# Patient Record
Sex: Female | Born: 1950 | Race: White | Hispanic: No | Smoking: Current every day smoker
Health system: Southern US, Community
[De-identification: ages and names within clinical notes are randomized; demographics above are authoritative.]

## PROBLEM LIST (undated history)

## (undated) DIAGNOSIS — Z9119 Patient's noncompliance with other medical treatment and regimen: Secondary | ICD-10-CM

## (undated) DIAGNOSIS — I10 Essential (primary) hypertension: Secondary | ICD-10-CM

## (undated) DIAGNOSIS — Z720 Tobacco use: Secondary | ICD-10-CM

## (undated) DIAGNOSIS — Z91199 Patient's noncompliance with other medical treatment and regimen due to unspecified reason: Secondary | ICD-10-CM

## (undated) DIAGNOSIS — Z789 Other specified health status: Secondary | ICD-10-CM

## (undated) DIAGNOSIS — I5022 Chronic systolic (congestive) heart failure: Secondary | ICD-10-CM

## (undated) DIAGNOSIS — Z7289 Other problems related to lifestyle: Secondary | ICD-10-CM

## (undated) HISTORY — PX: NO PAST SURGERIES: SHX2092

---

## 2016-02-04 ENCOUNTER — Other Ambulatory Visit: Payer: Self-pay | Admitting: Family Medicine

## 2016-02-04 ENCOUNTER — Ambulatory Visit
Admission: RE | Admit: 2016-02-04 | Discharge: 2016-02-04 | Disposition: A | Discharge status: Home / Self Care | Payer: Medicare Other | Source: Ambulatory Visit | Attending: Family Medicine | Admitting: Family Medicine

## 2016-02-04 DIAGNOSIS — I159 Secondary hypertension, unspecified: Secondary | ICD-10-CM

## 2016-02-04 DIAGNOSIS — I10 Essential (primary) hypertension: Secondary | ICD-10-CM | POA: Diagnosis not present

## 2016-02-04 DIAGNOSIS — R0602 Shortness of breath: Secondary | ICD-10-CM

## 2017-03-10 ENCOUNTER — Emergency Department: Payer: Medicare Other

## 2017-03-10 ENCOUNTER — Inpatient Hospital Stay
Admission: EM | Admit: 2017-03-10 | Discharge: 2017-03-12 | DRG: 191 | Disposition: A | Discharge status: Home / Self Care | Payer: Medicare Other | Attending: Internal Medicine | Admitting: Internal Medicine

## 2017-03-10 DIAGNOSIS — Z79899 Other long term (current) drug therapy: Secondary | ICD-10-CM | POA: Diagnosis not present

## 2017-03-10 DIAGNOSIS — F1090 Alcohol use, unspecified, uncomplicated: Secondary | ICD-10-CM | POA: Diagnosis present

## 2017-03-10 DIAGNOSIS — J441 Chronic obstructive pulmonary disease with (acute) exacerbation: Principal | ICD-10-CM | POA: Diagnosis present

## 2017-03-10 DIAGNOSIS — I509 Heart failure, unspecified: Secondary | ICD-10-CM | POA: Diagnosis present

## 2017-03-10 DIAGNOSIS — E876 Hypokalemia: Secondary | ICD-10-CM | POA: Diagnosis present

## 2017-03-10 DIAGNOSIS — I110 Hypertensive heart disease with heart failure: Secondary | ICD-10-CM | POA: Diagnosis present

## 2017-03-10 DIAGNOSIS — Z8249 Family history of ischemic heart disease and other diseases of the circulatory system: Secondary | ICD-10-CM

## 2017-03-10 DIAGNOSIS — F1010 Alcohol abuse, uncomplicated: Secondary | ICD-10-CM | POA: Diagnosis present

## 2017-03-10 DIAGNOSIS — Z789 Other specified health status: Secondary | ICD-10-CM | POA: Diagnosis present

## 2017-03-10 DIAGNOSIS — E871 Hypo-osmolality and hyponatremia: Secondary | ICD-10-CM | POA: Diagnosis present

## 2017-03-10 DIAGNOSIS — R0600 Dyspnea, unspecified: Secondary | ICD-10-CM

## 2017-03-10 DIAGNOSIS — I10 Essential (primary) hypertension: Secondary | ICD-10-CM | POA: Diagnosis present

## 2017-03-10 DIAGNOSIS — F17210 Nicotine dependence, cigarettes, uncomplicated: Secondary | ICD-10-CM | POA: Diagnosis present

## 2017-03-10 DIAGNOSIS — Z7289 Other problems related to lifestyle: Secondary | ICD-10-CM | POA: Diagnosis present

## 2017-03-10 HISTORY — DX: Other problems related to lifestyle: Z72.89

## 2017-03-10 HISTORY — DX: Essential (primary) hypertension: I10

## 2017-03-10 HISTORY — DX: Other specified health status: Z78.9

## 2017-03-10 LAB — URINE DRUG SCREEN, QUALITATIVE (ARMC ONLY)
Amphetamines, Ur Screen: NOT DETECTED
BENZODIAZEPINE, UR SCRN: NOT DETECTED
Barbiturates, Ur Screen: NOT DETECTED
Cannabinoid 50 Ng, Ur ~~LOC~~: NOT DETECTED
Cocaine Metabolite,Ur ~~LOC~~: NOT DETECTED
MDMA (Ecstasy)Ur Screen: NOT DETECTED
METHADONE SCREEN, URINE: NOT DETECTED
Opiate, Ur Screen: NOT DETECTED
PHENCYCLIDINE (PCP) UR S: NOT DETECTED
Tricyclic, Ur Screen: NOT DETECTED

## 2017-03-10 LAB — URINALYSIS, COMPLETE (UACMP) WITH MICROSCOPIC
BILIRUBIN URINE: NEGATIVE
Glucose, UA: NEGATIVE mg/dL
HGB URINE DIPSTICK: NEGATIVE
KETONES UR: NEGATIVE mg/dL
LEUKOCYTES UA: NEGATIVE
NITRITE: NEGATIVE
PROTEIN: 30 mg/dL — AB
Specific Gravity, Urine: 1.015 (ref 1.005–1.030)
pH: 8 (ref 5.0–8.0)

## 2017-03-10 LAB — CBC WITH DIFFERENTIAL/PLATELET
BASOS ABS: 0.1 10*3/uL (ref 0–0.1)
Basophils Relative: 1 %
Eosinophils Absolute: 0 10*3/uL (ref 0–0.7)
Eosinophils Relative: 0 %
HEMATOCRIT: 41.9 % (ref 35.0–47.0)
Hemoglobin: 14.6 g/dL (ref 12.0–16.0)
LYMPHS ABS: 0.9 10*3/uL — AB (ref 1.0–3.6)
LYMPHS PCT: 14 %
MCH: 33.4 pg (ref 26.0–34.0)
MCHC: 34.8 g/dL (ref 32.0–36.0)
MCV: 96.1 fL (ref 80.0–100.0)
Monocytes Absolute: 0.5 10*3/uL (ref 0.2–0.9)
Monocytes Relative: 8 %
NEUTROS ABS: 5.1 10*3/uL (ref 1.4–6.5)
Neutrophils Relative %: 77 %
Platelets: 283 10*3/uL (ref 150–440)
RBC: 4.35 MIL/uL (ref 3.80–5.20)
RDW: 12.5 % (ref 11.5–14.5)
WBC: 6.6 10*3/uL (ref 3.6–11.0)

## 2017-03-10 LAB — BASIC METABOLIC PANEL
ANION GAP: 11 (ref 5–15)
Anion gap: 11 (ref 5–15)
BUN: 19 mg/dL (ref 6–20)
BUN: 16 mg/dL (ref 6–20)
CHLORIDE: 87 mmol/L — AB (ref 101–111)
CO2: 28 mmol/L (ref 22–32)
CO2: 28 mmol/L (ref 22–32)
CREATININE: 0.78 mg/dL (ref 0.44–1.00)
Calcium: 9.1 mg/dL (ref 8.9–10.3)
Calcium: 9.5 mg/dL (ref 8.9–10.3)
Chloride: 85 mmol/L — ABNORMAL LOW (ref 101–111)
Creatinine, Ser: 0.72 mg/dL (ref 0.44–1.00)
GFR calc Af Amer: >60 mL/min (ref >60)
GFR calc Af Amer: >60 mL/min (ref >60)
GFR calc non Af Amer: >60 mL/min (ref >60)
GFR calc non Af Amer: >60 mL/min (ref >60)
GLUCOSE: 102 mg/dL — AB (ref 65–99)
GLUCOSE: 263 mg/dL — AB (ref 65–99)
POTASSIUM: 4.2 mmol/L (ref 3.5–5.1)
POTASSIUM: 3.3 mmol/L — AB (ref 3.5–5.1)
Sodium: 124 mmol/L — ABNORMAL LOW (ref 135–145)
Sodium: 126 mmol/L — ABNORMAL LOW (ref 135–145)

## 2017-03-10 LAB — CBC
HEMATOCRIT: 41.7 % (ref 35.0–47.0)
Hemoglobin: 14.3 g/dL (ref 12.0–16.0)
MCH: 33.3 pg (ref 26.0–34.0)
MCHC: 34.2 g/dL (ref 32.0–36.0)
MCV: 97.4 fL (ref 80.0–100.0)
Platelets: 271 10*3/uL (ref 150–440)
RBC: 4.29 MIL/uL (ref 3.80–5.20)
RDW: 12.3 % (ref 11.5–14.5)
WBC: 7.4 10*3/uL (ref 3.6–11.0)

## 2017-03-10 LAB — BRAIN NATRIURETIC PEPTIDE: B Natriuretic Peptide: 195 pg/mL — ABNORMAL HIGH (ref 0.0–100.0)

## 2017-03-10 LAB — TROPONIN I: Troponin I: <0.03 ng/mL (ref <0.03)

## 2017-03-10 MED ORDER — LORAZEPAM 2 MG/ML IJ SOLN: 1.0000 mg | Freq: Four times a day (QID) | INTRAMUSCULAR | Status: DC | PRN

## 2017-03-10 MED ORDER — LORAZEPAM 2 MG PO TABS
0.0000 mg | ORAL_TABLET | Freq: Four times a day (QID) | ORAL | Status: DC
  Administered 2017-03-11: 2 mg via ORAL
  Filled 2017-03-10: qty 1

## 2017-03-10 MED ORDER — IPRATROPIUM-ALBUTEROL 0.5-2.5 (3) MG/3ML IN SOLN
3.0000 mL | Freq: Once | RESPIRATORY_TRACT | Status: AC
  Administered 2017-03-10: 3 mL via RESPIRATORY_TRACT
  Filled 2017-03-10: qty 3

## 2017-03-10 MED ORDER — THIAMINE HCL 100 MG/ML IJ SOLN: 100.0000 mg | Freq: Every day | INTRAMUSCULAR | Status: DC

## 2017-03-10 MED ORDER — ENOXAPARIN SODIUM 40 MG/0.4ML ~~LOC~~ SOLN
40.0000 mg | SUBCUTANEOUS | Status: DC
  Filled 2017-03-10 (×2): qty 0.4

## 2017-03-10 MED ORDER — LISINOPRIL 20 MG PO TABS
20.0000 mg | ORAL_TABLET | Freq: Every day | ORAL | Status: DC
  Administered 2017-03-11 – 2017-03-12 (×2): 20 mg via ORAL
  Filled 2017-03-10 (×2): qty 1

## 2017-03-10 MED ORDER — IOPAMIDOL (ISOVUE-370) INJECTION 76%
75.0000 mL | Freq: Once | INTRAVENOUS | Status: AC | PRN
  Administered 2017-03-10: 75 mL via INTRAVENOUS

## 2017-03-10 MED ORDER — BENZONATATE 100 MG PO CAPS: 200.0000 mg | ORAL_CAPSULE | Freq: Three times a day (TID) | ORAL | Status: DC | PRN

## 2017-03-10 MED ORDER — FOLIC ACID 1 MG PO TABS
1.0000 mg | ORAL_TABLET | Freq: Every day | ORAL | Status: DC
  Administered 2017-03-11 – 2017-03-12 (×2): 1 mg via ORAL
  Filled 2017-03-10 (×2): qty 1

## 2017-03-10 MED ORDER — ATENOLOL 50 MG PO TABS
25.0000 mg | ORAL_TABLET | Freq: Every day | ORAL | Status: DC
  Administered 2017-03-11 – 2017-03-12 (×2): 25 mg via ORAL
  Filled 2017-03-10 (×2): qty 1

## 2017-03-10 MED ORDER — ONDANSETRON HCL 4 MG/2ML IJ SOLN: 4.0000 mg | Freq: Four times a day (QID) | INTRAMUSCULAR | Status: DC | PRN

## 2017-03-10 MED ORDER — SODIUM CHLORIDE 0.9 % IV SOLN
Freq: Once | INTRAVENOUS | Status: AC
  Administered 2017-03-10: 1 mL via INTRAVENOUS

## 2017-03-10 MED ORDER — ADULT MULTIVITAMIN W/MINERALS CH
1.0000 | ORAL_TABLET | Freq: Every day | ORAL | Status: DC
  Administered 2017-03-11 – 2017-03-12 (×2): 1 via ORAL
  Filled 2017-03-10 (×2): qty 1

## 2017-03-10 MED ORDER — ACETAMINOPHEN 325 MG PO TABS
650.0000 mg | ORAL_TABLET | Freq: Four times a day (QID) | ORAL | Status: DC | PRN
  Administered 2017-03-11 – 2017-03-12 (×3): 650 mg via ORAL
  Filled 2017-03-10 (×3): qty 2

## 2017-03-10 MED ORDER — LORAZEPAM 1 MG PO TABS: 1.0000 mg | ORAL_TABLET | Freq: Four times a day (QID) | ORAL | Status: DC | PRN

## 2017-03-10 MED ORDER — GUAIFENESIN-DM 100-10 MG/5ML PO SYRP: 5.0000 mL | ORAL_SOLUTION | ORAL | Status: DC | PRN

## 2017-03-10 MED ORDER — IPRATROPIUM-ALBUTEROL 0.5-2.5 (3) MG/3ML IN SOLN: 3.0000 mL | RESPIRATORY_TRACT | Status: DC | PRN

## 2017-03-10 MED ORDER — ONDANSETRON HCL 4 MG PO TABS: 4.0000 mg | ORAL_TABLET | Freq: Four times a day (QID) | ORAL | Status: DC | PRN

## 2017-03-10 MED ORDER — ACETAMINOPHEN 650 MG RE SUPP: 650.0000 mg | Freq: Four times a day (QID) | RECTAL | Status: DC | PRN

## 2017-03-10 MED ORDER — LORAZEPAM 2 MG/ML IJ SOLN
0.5000 mg | Freq: Once | INTRAMUSCULAR | Status: AC
  Administered 2017-03-10: 0.5 mg via INTRAVENOUS
  Filled 2017-03-10: qty 1

## 2017-03-10 MED ORDER — LORAZEPAM 2 MG PO TABS: 0.0000 mg | ORAL_TABLET | Freq: Two times a day (BID) | ORAL | Status: DC

## 2017-03-10 MED ORDER — AZITHROMYCIN 500 MG PO TABS
500.0000 mg | ORAL_TABLET | Freq: Every day | ORAL | Status: DC
  Administered 2017-03-11 – 2017-03-12 (×2): 500 mg via ORAL
  Filled 2017-03-10 (×2): qty 1

## 2017-03-10 MED ORDER — VITAMIN B-1 100 MG PO TABS
100.0000 mg | ORAL_TABLET | Freq: Every day | ORAL | Status: DC
  Administered 2017-03-11 – 2017-03-12 (×2): 100 mg via ORAL
  Filled 2017-03-10 (×3): qty 1

## 2017-03-10 MED ORDER — METHYLPREDNISOLONE SODIUM SUCC 125 MG IJ SOLR
60.0000 mg | Freq: Four times a day (QID) | INTRAMUSCULAR | Status: DC
  Administered 2017-03-11 (×3): 60 mg via INTRAVENOUS
  Filled 2017-03-10 (×3): qty 2

## 2017-03-10 MED ORDER — METHYLPREDNISOLONE SODIUM SUCC 125 MG IJ SOLR
125.0000 mg | Freq: Once | INTRAMUSCULAR | Status: AC
  Administered 2017-03-10: 125 mg via INTRAVENOUS
  Filled 2017-03-10: qty 2

## 2017-03-10 NOTE — ED Provider Notes (Signed)
[facility] Emergency Department Provider Note       Time seen: ----------------------------------------- 4:15 PM on 03/10/2017 -----------------------------------------     I have reviewed the triage vital signs and the nursing notes.   HISTORY   Chief Complaint No chief complaint on file.    HPI [name] is a 66 y.o. female who presents to the ED for shortness of breath. Patient reports she has been short of breath for "a while". She denies any fevers, chills, chest pain does have shortness of breath with some cough. She denies vomiting or diarrhea. Nothing makes her symptoms better or worse. Reportedly she has history of congestive heart failure.   No past medical history on file.  There are no active problems to display for this patient.   No past surgical history on file.  Allergies Patient has no allergy information on record.  Social History Social History  Substance Use Topics  . Smoking status: Not on file  . Smokeless tobacco: Not on file  . Alcohol use Not on file    Review of Systems Constitutional: Negative for fever. Eyes: Negative for vision changes ENT:  Negative for congestion, sore throat Cardiovascular: Negative for chest pain. Respiratory: Positive for shortness of breath Gastrointestinal: Negative for abdominal pain, vomiting and diarrhea. Genitourinary: Negative for dysuria. Musculoskeletal: Negative for back pain. Skin: Negative for rash. Neurological: Negative for headaches, positive for weakness  All systems negative/normal/unremarkable except as stated in the HPI  ____________________________________________   PHYSICAL EXAM:  VITAL SIGNS: ED Triage Vitals  Enc Vitals Group     BP      Pulse      Resp      Temp      Temp src      SpO2      Weight      Height      Head Circumference      Peak Flow      Pain Score      Pain Loc      Pain Edu?      Excl. in GC?     Constitutional:  Alert and oriented. Mild distress Eyes: Conjunctivae are normal. Normal extraocular movements. ENT   Head: Normocephalic and atraumatic.   Nose: No congestion/rhinnorhea.   Mouth/Throat: Mucous membranes are moist.   Neck: No stridor. Cardiovascular: Normal rate, regular rhythm. No murmurs, rubs, or gallops. Respiratory: Diminished breath sounds bilaterally Gastrointestinal: Soft and nontender. Normal bowel sounds Musculoskeletal: Nontender with normal range of motion in extremities. No lower extremity tenderness nor edema. Neurologic:  Normal speech and language. No gross focal neurologic deficits are appreciated.  Skin:  Skin is warm, dry and intact. No rash noted. Psychiatric: Mood and affect are normal. Speech and behavior are normal.  ____________________________________________  EKG: Interpreted by me. Sinus rhythm rate of 77 bpm, normal.  , Wide QRS, long QT, LVH  ____________________________________________  ED COURSE:  Pertinent labs & imaging results that were available during my care of the patient were reviewed by me and considered in my medical decision making (see chart for details). Patient presents for dyspnea, we will assess with labs and imaging as indicated.   Procedures ____________________________________________   LABS (pertinent positives/negatives)  Labs Reviewed  CBC WITH DIFFERENTIAL/PLATELET - Abnormal; Notable for the following:       Result Value   Lymphs Abs 0.9 (*)    All other components within normal limits  BASIC METABOLIC PANEL - Abnormal; Notable for the following:  Sodium 124 (*)    Chloride 85 (*)    Glucose, Bld 102 (*)    All other components within normal limits  BRAIN NATRIURETIC PEPTIDE - Abnormal; Notable for the following:    B Natriuretic Peptide 195.0 (*)    All other components within normal limits  URINALYSIS, COMPLETE (UACMP) WITH MICROSCOPIC - Abnormal; Notable for the following:    Color, Urine STRAW (*)     APPearance CLEAR (*)    Protein, ur 30 (*)    Bacteria, UA RARE (*)    Squamous Epithelial / LPF 0-5 (*)    All other components within normal limits  BLOOD GAS, VENOUS - Abnormal; Notable for the following:    pCO2, Ven 62 (*)    Bicarbonate 31.2 (*)    Acid-Base Excess 3.1 (*)    All other components within normal limits  TROPONIN I  URINE DRUG SCREEN, QUALITATIVE ([facility] ONLY)  ETHANOL    RADIOLOGY Images were viewed by me  Chest x-ray IMPRESSION: Cardiomegaly. No active disease. ____________________________________________  FINAL ASSESSMENT AND PLAN  Dyspnea, Likely COPD exacerbation, chronic alcohol abuse, hyponatremia  Plan: Patient's labs and imaging were dictated above. Patient had presented for dyspnea and confusion, likely underlying COPD with exacerbation. She smokes over a pack a day and also drinks heavily. She continues to have difficulty breathing with diminished breath sounds after DuoNeb since steroids. I will discuss with the hospitalist for admission.   Emily Filbert, MD   Note: This note was generated in part or whole with voice recognition software. Voice recognition is usually quite accurate but there are transcription errors that can and very often do occur. I apologize for any typographical errors that were not detected and corrected.     Emily Filbert, MD 03/10/17 2149

## 2017-03-10 NOTE — ED Notes (Signed)
Assisted pt to bathroom

## 2017-03-10 NOTE — H&P (Signed)
[facility] [place] [facility]   PATIENT NAME: Paige Kelley    MR#:  [id number]  DATE OF BIRTH:  09/30/1951  DATE OF ADMISSION:  03/10/2017  PRIMARY CARE PHYSICIAN: Pricilla Holm, MD   REQUESTING/REFERRING PHYSICIAN: Mayford Knife, MD  CHIEF COMPLAINT:   Chief Complaint  Patient presents with  . Shortness of Breath    HISTORY OF PRESENT ILLNESS:  Paige Kelley  is a 66 y.o. female who presents with Several days of progressive shortness of breath. Workup in the ED is suggestive of COPD exacerbation. Patient does not have a formal diagnosis of COPD, but is a long-time smoker and imaging here in the ED today is consistent with emphysematous changes in her lung. Hospitalists were called for admission  PAST MEDICAL HISTORY:   Past Medical History:  Diagnosis Date  . Alcohol use   . CHF (congestive heart failure) (HCC)   . Hypertension     PAST SURGICAL HISTORY:   Past Surgical History:  Procedure Laterality Date  . NO PAST SURGERIES      SOCIAL HISTORY:   Social History  Substance Use Topics  . Smoking status: Current Every Day Smoker    Packs/day: 1.00    Types: Cigarettes  . Smokeless tobacco: Never Used  . Alcohol use 2.4 oz/week    4 Cans of beer per week    FAMILY HISTORY:   Family History  Problem Relation Age of Onset  . Hypertension Other     DRUG ALLERGIES:  No Known Allergies  MEDICATIONS AT HOME:   Prior to Admission medications   Medication Sig Start Date End Date Taking? Authorizing Provider  atenolol (TENORMIN) 25 MG tablet Take 25 mg by mouth daily. 01/18/17  Yes [provider]  chlorthalidone (HYGROTON) 25 MG tablet Take 25 mg by mouth daily. 01/12/17  Yes [provider]  lisinopril (PRINIVIL,ZESTRIL) 20 MG tablet Take 20 mg by mouth daily. 01/12/17  Yes [provider]    REVIEW OF SYSTEMS:  Review of Systems  Constitutional: Negative for chills, fever, malaise/fatigue and weight  loss.  HENT: Negative for ear pain, hearing loss and tinnitus.   Eyes: Negative for blurred vision, double vision, pain and redness.  Respiratory: Positive for cough, shortness of breath and wheezing. Negative for hemoptysis.   Cardiovascular: Negative for chest pain, palpitations, orthopnea and leg swelling.  Gastrointestinal: Negative for abdominal pain, constipation, diarrhea, nausea and vomiting.  Genitourinary: Negative for dysuria, frequency and hematuria.  Musculoskeletal: Negative for back pain, joint pain and neck pain.  Skin:       No acne, rash, or lesions  Neurological: Negative for dizziness, tremors, focal weakness and weakness.  Endo/Heme/Allergies: Negative for polydipsia. Does not bruise/bleed easily.  Psychiatric/Behavioral: Negative for depression. The patient is not nervous/anxious and does not have insomnia.      VITAL SIGNS:   Vitals:   03/10/17 1628 03/10/17 1730 03/10/17 2214  BP: (!) 179/118 (!) 181/110 (!) 167/80  Pulse: 76 79 85  Resp: (!) 22 (!) 34 (!) 26  Temp: 97.8 F (36.6 C)    TempSrc: Oral    SpO2: 97% 95% 93%  Weight: 62.2 kg (137 lb 2 oz)    Height: 5\' 7"  (1.702 m)     Wt Readings from Last 3 Encounters:  03/10/17 62.2 kg (137 lb 2 oz)    PHYSICAL EXAMINATION:  Physical Exam  Vitals reviewed. Constitutional: She is oriented to person, place, and time. She appears well-developed and well-nourished.  No distress.  HENT:  Head: Normocephalic and atraumatic.  Mouth/Throat: Oropharynx is clear and moist.  Eyes: Conjunctivae and EOM are normal. Pupils are equal, round, and reactive to light. No scleral icterus.  Neck: Normal range of motion. Neck supple. No JVD present. No thyromegaly present.  Cardiovascular: Normal rate, regular rhythm and intact distal pulses.  Exam reveals no gallop and no friction rub.   No murmur heard. Respiratory: Effort normal. No respiratory distress. She has wheezes. She has no rales.  GI: Soft. Bowel sounds are  normal. She exhibits no distension. There is no tenderness.  Musculoskeletal: Normal range of motion. She exhibits no edema.  No arthritis, no gout  Lymphadenopathy:    She has no cervical adenopathy.  Neurological: She is alert and oriented to person, place, and time. No cranial nerve deficit.  No dysarthria, no aphasia  Skin: Skin is warm and dry. No rash noted. No erythema.  Psychiatric: She has a normal mood and affect. Her behavior is normal. Judgment and thought content normal.    LABORATORY PANEL:   CBC  Recent Labs Lab 03/10/17 1635  WBC 6.6  HGB 14.6  HCT 41.9  PLT 283   ------------------------------------------------------------------------------------------------------------------  Chemistries   Recent Labs Lab 03/10/17 1635  NA 124*  K 4.2  CL 85*  CO2 28  GLUCOSE 102*  BUN 19  CREATININE 0.72  CALCIUM 9.5   ------------------------------------------------------------------------------------------------------------------  Cardiac Enzymes  Recent Labs Lab 03/10/17 1635  TROPONINI <0.03   ------------------------------------------------------------------------------------------------------------------  RADIOLOGY:  Ct Angio Chest Pe W Or Wo Contrast  Result Date: 03/10/2017 CLINICAL DATA:  Hypertension, CHF, smoker, dyspnea EXAM: CT ANGIOGRAPHY CHEST WITH CONTRAST TECHNIQUE: Multidetector CT imaging of the chest was performed using the standard protocol during bolus administration of intravenous contrast. Multiplanar CT image reconstructions and MIPs were obtained to evaluate the vascular anatomy. CONTRAST:  75 cc Isovue 370 IV COMPARISON:  None FINDINGS: Cardiovascular: Atherosclerotic calcifications aorta, coronary arteries and proximal great vessels. Upper normal caliber of ascending thoracic aorta without definite aneurysm or dissection. Pulmonary arteries well opacified an patent. No evidence of pulmonary embolism. Enlargement of main pulmonary  artery question pulmonary arterial hypertension. No pericardial effusion. Mediastinum/Nodes: Few normal size mediastinal lymph nodes without thoracic adenopathy. Visualized base of cervical region normal appearance. Esophagus unremarkable. Lungs/Pleura: Emphysematous changes diffusely. Tiny nodular density RIGHT middle lobe nodule sagittal image 23. No pulmonary infiltrate, pleural effusion or pneumothorax. Upper Abdomen: Normal appearance Musculoskeletal: Degenerative disc disease changes at lower cervical spine. Probable mid thoracic vertebral hemangioma. Review of the MIP images confirms the above findings. IMPRESSION: No evidence of pulmonary embolism. Question pulmonary artery hypertension. Aortic atherosclerosis and coronary artery calcification. Diffuse emphysematous changes without infiltrate. 4 mm RIGHT middle lobe nodule, recommendation below. No follow-up needed if patient is low-risk. Non-contrast chest CT can be considered in 12 months if patient is high-risk. This recommendation follows the consensus statement: Guidelines for Management of Incidental Pulmonary Nodules Detected on CT Images: From the Fleischner Society 2017; Radiology 2017; 284:228-243. Electronically Signed   By: Ulyses SouthwardMark  Boles M.D.   On: 03/10/2017 18:52   Dg Chest Port 1 View  Result Date: 03/10/2017 CLINICAL DATA:  Shortness of breath.  CHF and hypertension. EXAM: PORTABLE CHEST 1 VIEW COMPARISON:  02/04/2016. FINDINGS: The heart is enlarged. There are no infiltrates or failure. No effusion or pneumothorax. Bones unremarkable. Similar appearance to priors. IMPRESSION: Cardiomegaly.  No active disease. Electronically Signed   By: Elsie StainJohn T Curnes M.D.   On: 03/10/2017 16:49  EKG:   Orders placed or performed during the hospital encounter of 03/10/17  . ED EKG  . ED EKG    IMPRESSION AND PLAN:  Principal Problem:   COPD with acute exacerbation (HCC) - IV steroids, when necessary antitussive, azithromycin, when necessary  DuoNeb's Active Problems:   Alcohol use - CIWA protocol   HTN (hypertension) - continue home meds   Hyponatremia - possibly due to alcohol use, more likely due to chlorthalidone, hold chlorthalidone  All the records are reviewed and case discussed with ED provider. Management plans discussed with the patient and/or family.  DVT PROPHYLAXIS: SubQ lovenox  GI PROPHYLAXIS: None  ADMISSION STATUS: Inpatient  CODE STATUS: Full Code Status History    This patient does not have a recorded code status. Please follow your organizational policy for patients in this situation.      TOTAL TIME TAKING CARE OF THIS PATIENT: 45 minutes.   Jazalyn Mondor FIELDING 03/10/2017, 10:20 PM  [employer]  Office  [phone]  CC: Primary care physician; Pricilla Holm, MD  Note:  This document was prepared using Dragon voice recognition software and may include unintentional dictation errors.

## 2017-03-10 NOTE — ED Triage Notes (Signed)
Pt came to ED via EMS c/o sob. History of CHF. EMS tried to give pt breathing treatment, refused halfway through. HR 76, bp 178/118, history htn.

## 2017-03-10 NOTE — ED Notes (Signed)
Assisted pt. To bathroom. 

## 2017-03-11 LAB — BASIC METABOLIC PANEL
ANION GAP: 10 (ref 5–15)
BUN: 21 mg/dL — ABNORMAL HIGH (ref 6–20)
CHLORIDE: 87 mmol/L — AB (ref 101–111)
CO2: 30 mmol/L (ref 22–32)
Calcium: 9.3 mg/dL (ref 8.9–10.3)
Creatinine, Ser: 0.83 mg/dL (ref 0.44–1.00)
GFR calc Af Amer: >60 mL/min (ref >60)
GFR calc non Af Amer: >60 mL/min (ref >60)
Glucose, Bld: 302 mg/dL — ABNORMAL HIGH (ref 65–99)
Potassium: 3.3 mmol/L — ABNORMAL LOW (ref 3.5–5.1)
SODIUM: 127 mmol/L — AB (ref 135–145)

## 2017-03-11 LAB — ETHANOL

## 2017-03-11 LAB — MAGNESIUM: MAGNESIUM: 1.8 mg/dL (ref 1.7–2.4)

## 2017-03-11 MED ORDER — AMLODIPINE BESYLATE 10 MG PO TABS
10.0000 mg | ORAL_TABLET | Freq: Every day | ORAL | Status: DC
  Administered 2017-03-11 – 2017-03-12 (×2): 10 mg via ORAL
  Filled 2017-03-11 (×2): qty 1

## 2017-03-11 MED ORDER — SODIUM CHLORIDE 0.9 % IV SOLN
Freq: Once | INTRAVENOUS | Status: AC
  Administered 2017-03-11: 11:00:00 via INTRAVENOUS

## 2017-03-11 MED ORDER — MAGNESIUM SULFATE 2 GM/50ML IV SOLN
2.0000 g | Freq: Once | INTRAVENOUS | Status: AC
  Administered 2017-03-11: 2 g via INTRAVENOUS
  Filled 2017-03-11: qty 50

## 2017-03-11 MED ORDER — METHYLPREDNISOLONE SODIUM SUCC 40 MG IJ SOLR
40.0000 mg | Freq: Two times a day (BID) | INTRAMUSCULAR | Status: DC
  Administered 2017-03-11 – 2017-03-12 (×2): 40 mg via INTRAVENOUS
  Filled 2017-03-11 (×2): qty 1

## 2017-03-11 MED ORDER — POTASSIUM CHLORIDE CRYS ER 20 MEQ PO TBCR
40.0000 meq | EXTENDED_RELEASE_TABLET | Freq: Once | ORAL | Status: AC
  Administered 2017-03-11: 40 meq via ORAL
  Filled 2017-03-11: qty 2

## 2017-03-11 NOTE — Plan of Care (Signed)
Problem: Safety: Goal: Ability to remain free from injury will improve Outcome: Progressing Patient is not impulsive at this time.

## 2017-03-11 NOTE — Progress Notes (Signed)
[facility] -  at [facility]   PATIENT NAME: Paige Kelley    MR#:  [id number]  DATE OF BIRTH:  October 04, 1951  SUBJECTIVE:  CHIEF COMPLAINT:   Chief Complaint  Patient presents with  . Shortness of Breath   Better cough and wheezing. No shortness of breath. REVIEW OF SYSTEMS:  Review of Systems  Constitutional: Negative for chills, fever and malaise/fatigue.  HENT: Negative for congestion and sore throat.   Eyes: Negative for blurred vision and double vision.  Respiratory: Positive for cough and wheezing. Negative for hemoptysis, sputum production, shortness of breath and stridor.   Cardiovascular: Negative for chest pain, palpitations and leg swelling.  Gastrointestinal: Negative for abdominal pain, blood in stool, constipation, diarrhea, melena, nausea and vomiting.  Genitourinary: Negative for dysuria, hematuria and urgency.  Musculoskeletal: Negative for back pain.  Skin: Negative for itching and rash.  Neurological: Negative for dizziness, tremors, sensory change, focal weakness, loss of consciousness, weakness and headaches.  Psychiatric/Behavioral: Negative for depression. The patient is not nervous/anxious.     DRUG ALLERGIES:  No Known Allergies VITALS:  Blood pressure (!) 179/78, pulse 82, temperature 97.6 F (36.4 C), temperature source Oral, resp. rate 19, height 5\' 6"  (1.676 m), weight 132 lb 4.8 oz (60 kg), SpO2 98 %. PHYSICAL EXAMINATION:  Physical Exam  Constitutional: She is oriented to person, place, and time and well-developed, well-nourished, and in no distress.  HENT:  Head: Normocephalic.  Mouth/Throat: Oropharynx is clear and moist.  Eyes: Conjunctivae and EOM are normal.  Neck: Normal range of motion. Neck supple. No JVD present. No tracheal deviation present.  Cardiovascular: Normal rate, regular rhythm and normal heart sounds.  Exam reveals no gallop.   No murmur heard. Pulmonary/Chest: Effort normal. No respiratory  distress. She has wheezes. She has no rales.  Abdominal: Soft. Bowel sounds are normal. She exhibits no distension. There is no tenderness.  Musculoskeletal: Normal range of motion. She exhibits no edema or tenderness.  Neurological: She is alert and oriented to person, place, and time. No cranial nerve deficit.  Skin: No rash noted. No erythema.  Psychiatric: Affect normal.   LABORATORY PANEL:  Female CBC  Recent Labs Lab 03/10/17 2336  WBC 7.4  HGB 14.3  HCT 41.7  PLT 271   ------------------------------------------------------------------------------------------------------------------ Chemistries   Recent Labs Lab 03/11/17 0936  NA 127*  K 3.3*  CL 87*  CO2 30  GLUCOSE 302*  BUN 21*  CREATININE 0.83  CALCIUM 9.3  MG 1.8   RADIOLOGY:  Ct Angio Chest Pe W Or Wo Contrast  Result Date: 03/10/2017 CLINICAL DATA:  Hypertension, CHF, smoker, dyspnea EXAM: CT ANGIOGRAPHY CHEST WITH CONTRAST TECHNIQUE: Multidetector CT imaging of the chest was performed using the standard protocol during bolus administration of intravenous contrast. Multiplanar CT image reconstructions and MIPs were obtained to evaluate the vascular anatomy. CONTRAST:  75 cc Isovue 370 IV COMPARISON:  None FINDINGS: Cardiovascular: Atherosclerotic calcifications aorta, coronary arteries and proximal great vessels. Upper normal caliber of ascending thoracic aorta without definite aneurysm or dissection. Pulmonary arteries well opacified an patent. No evidence of pulmonary embolism. Enlargement of main pulmonary artery question pulmonary arterial hypertension. No pericardial effusion. Mediastinum/Nodes: Few normal size mediastinal lymph nodes without thoracic adenopathy. Visualized base of cervical region normal appearance. Esophagus unremarkable. Lungs/Pleura: Emphysematous changes diffusely. Tiny nodular density RIGHT middle lobe nodule sagittal image 23. No pulmonary infiltrate, pleural effusion or pneumothorax. Upper  Abdomen: Normal appearance Musculoskeletal: Degenerative disc disease changes at lower cervical spine.  Probable mid thoracic vertebral hemangioma. Review of the MIP images confirms the above findings. IMPRESSION: No evidence of pulmonary embolism. Question pulmonary artery hypertension. Aortic atherosclerosis and coronary artery calcification. Diffuse emphysematous changes without infiltrate. 4 mm RIGHT middle lobe nodule, recommendation below. No follow-up needed if patient is low-risk. Non-contrast chest CT can be considered in 12 months if patient is high-risk. This recommendation follows the consensus statement: Guidelines for Management of Incidental Pulmonary Nodules Detected on CT Images: From the Fleischner Society 2017; Radiology 2017; 284:228-243. Electronically Signed   By: Ulyses SouthwardMark  Boles M.D.   On: 03/10/2017 18:52   Dg Chest Port 1 View  Result Date: 03/10/2017 CLINICAL DATA:  Shortness of breath.  CHF and hypertension. EXAM: PORTABLE CHEST 1 VIEW COMPARISON:  02/04/2016. FINDINGS: The heart is enlarged. There are no infiltrates or failure. No effusion or pneumothorax. Bones unremarkable. Similar appearance to priors. IMPRESSION: Cardiomegaly.  No active disease. Electronically Signed   By: Elsie StainJohn T Curnes M.D.   On: 03/10/2017 16:49   ASSESSMENT AND PLAN:    COPD with acute exacerbation  Taper IV steroids, when necessary antitussive, azithromycin, when necessary DuoNeb's  Hypokalemia. Give potassium supplement and follow-up BMP.    Alcohol use - CIWA protocol   HTN (hypertension) - continue home meds   Hyponatremia - possibly due to alcohol use, more likely due to chlorthalidone, hold chlorthalidone. Start NS iv, f/u Na.  Tobacco abuse. Smoking cessation was counseled for 3-4 minutes, the patient wants to quit but doesn't want nicotine patch.  All the records are reviewed and case discussed with Care Management/Social Worker. Management plans discussed with the patient, family and they  are in agreement.  CODE STATUS: Full Code  TOTAL TIME TAKING CARE OF THIS PATIENT: 36 minutes.   More than 50% of the time was spent in counseling/coordination of care: YES  POSSIBLE D/C IN 2 DAYS, DEPENDING ON CLINICAL CONDITION.   Shaune Pollackhen, Yoel Kaufhold M.D on 03/11/2017 at 1:50 PM  Between 7am to 6pm - Pager - [phone]  After 6pm go to [email or web address] - Scientist, research (life [email or web address])password [email or web address]  [facility]  Office  [phone]  CC: Primary care physician; Pricilla HolmSharpe, Leslie M, MD  Note: This dictation was prepared with Dragon dictation along with smaller phrase technology. Any transcriptional errors that result from this process are unintentional.

## 2017-03-11 NOTE — Plan of Care (Signed)
Problem: Skin Integrity: Goal: Risk for impaired skin integrity will decrease Outcome: Progressing Frequent BR assessments, using BSC, incontinent episode x 1 this shift

## 2017-03-11 NOTE — Progress Notes (Signed)
Patient A&Ox4 this shift. CIWA precautions in place, medicated x 1. Patient with c/o headache, medicated x 2. 2L-O2 acute, desat to 84%. Elevated BP this shift, norvasc added. Pills whole, with fair appetite.

## 2017-03-12 LAB — BASIC METABOLIC PANEL
Anion gap: 7 (ref 5–15)
BUN: 22 mg/dL — AB (ref 6–20)
CO2: 29 mmol/L (ref 22–32)
Calcium: 9.2 mg/dL (ref 8.9–10.3)
Chloride: 95 mmol/L — ABNORMAL LOW (ref 101–111)
Creatinine, Ser: 0.76 mg/dL (ref 0.44–1.00)
GFR calc Af Amer: >60 mL/min (ref >60)
GFR calc non Af Amer: >60 mL/min (ref >60)
Glucose, Bld: 138 mg/dL — ABNORMAL HIGH (ref 65–99)
Potassium: 4 mmol/L (ref 3.5–5.1)
SODIUM: 131 mmol/L — AB (ref 135–145)

## 2017-03-12 MED ORDER — BENZONATATE 100 MG PO CAPS: 100.0000 mg | ORAL_CAPSULE | Freq: Three times a day (TID) | ORAL | 0 refills | Status: DC | PRN

## 2017-03-12 MED ORDER — PREDNISONE 10 MG PO TABS: ORAL_TABLET | ORAL | 0 refills | Status: DC

## 2017-03-12 MED ORDER — AMLODIPINE BESYLATE 10 MG PO TABS: 10.0000 mg | ORAL_TABLET | Freq: Every day | ORAL | 0 refills | Status: DC

## 2017-03-12 NOTE — Discharge Summary (Signed)
[facility] - [place] at [facility]   PATIENT NAME: Paige Kelley    MR#:  [id number]  DATE OF BIRTH:  07/08/1951  DATE OF ADMISSION:  03/10/2017   ADMITTING PHYSICIAN: Oralia Manisavid Willis, MD  DATE OF DISCHARGE: 03/12/2017 PRIMARY CARE PHYSICIAN: Pricilla HolmSharpe, Leslie M, MD   ADMISSION DIAGNOSIS:  Hyponatremia [E87.1] Dyspnea [R06.00] COPD exacerbation (HCC) [J44.1] DISCHARGE DIAGNOSIS:  Principal Problem:   COPD with acute exacerbation (HCC) Active Problems:   Alcohol use   HTN (hypertension)   Hyponatremia  SECONDARY DIAGNOSIS:   Past Medical History:  Diagnosis Date  . Alcohol use   . CHF (congestive heart failure) (HCC)   . Hypertension    HOSPITAL COURSE:   COPD with acute exacerbation  She Been treated with IV steroids, when necessary antitussive, azithromycin, when necessary DuoNeb's. Her symptoms has much improved. Change to prednisone taper.  Hypokalemia. Given potassium supplement and improved.  Alcohol use - CIWA protocol, no signs of withdrawal. HTN (hypertension) - continue home meds Hyponatremia - possibly due to alcohol use, more likely due to chlorthalidone, hold chlorthalidone. Started NS iv. improving. Follow up BMP as outpatient.  Tobacco abuse. Smoking cessation was counseled for 3-4 minutes, the patient wants to quit but doesn't want nicotine patch.  DISCHARGE CONDITIONS:  Stable, discharge to home today. CONSULTS OBTAINED:   DRUG ALLERGIES:  No Known Allergies DISCHARGE MEDICATIONS:   Allergies as of 03/12/2017   No Known Allergies     Medication List    STOP taking these medications   chlorthalidone 25 MG tablet Commonly known as:  HYGROTON     TAKE these medications   amLODipine 10 MG tablet Commonly known as:  NORVASC Take 1 tablet (10 mg total) by mouth daily.   atenolol 25 MG tablet Commonly known as:  TENORMIN Take 25 mg by mouth daily.   benzonatate 100 MG capsule Commonly known as:  TESSALON Take  1 capsule (100 mg total) by mouth 3 (three) times daily as needed for cough.   lisinopril 20 MG tablet Commonly known as:  PRINIVIL,ZESTRIL Take 20 mg by mouth daily.   predniSONE 10 MG tablet Commonly known as:  DELTASONE 40 mg po daily for 2 days, 20 mg po daily for 2 days, 10 mg po daily for 2 days.        DISCHARGE INSTRUCTIONS:  See AVS.  If you experience worsening of your admission symptoms, develop shortness of breath, life threatening emergency, suicidal or homicidal thoughts you must seek medical attention immediately by calling 911 or calling your MD immediately  if symptoms less severe.  You Must read complete instructions/literature along with all the possible adverse reactions/side effects for all the Medicines you take and that have been prescribed to you. Take any new Medicines after you have completely understood and accpet all the possible adverse reactions/side effects.   Please note  You were cared for by a hospitalist during your hospital stay. If you have any questions about your discharge medications or the care you received while you were in the hospital after you are discharged, you can call the unit and asked to speak with the hospitalist on call if the hospitalist that took care of you is not available. Once you are discharged, your primary care physician will handle any further medical issues. Please note that NO REFILLS for any discharge medications will be authorized once you are discharged, as it is imperative that you return to your primary care physician (or establish a relationship  with a primary care physician if you do not have one) for your aftercare needs so that they can reassess your need for medications and monitor your lab values.    On the day of Discharge:  VITAL SIGNS:  Blood pressure (!) 153/68, pulse 88, temperature 98.6 F (37 C), temperature source Oral, resp. rate 18, height 5\' 6"  (1.676 m), weight 132 lb 4.8 oz (60 kg), SpO2 91  %. PHYSICAL EXAMINATION:  GENERAL:  66 y.o.-year-old patient lying in the bed with no acute distress.  EYES: Pupils equal, round, reactive to light and accommodation. No scleral icterus. Extraocular muscles intact.  HEENT: Head atraumatic, normocephalic. Oropharynx and nasopharynx clear.  NECK:  Supple, no jugular venous distention. No thyroid enlargement, no tenderness.  LUNGS: Normal breath sounds bilaterally, no wheezing, rales,rhonchi or crepitation. No use of accessory muscles of respiration.  CARDIOVASCULAR: S1, S2 normal. No murmurs, rubs, or gallops.  ABDOMEN: Soft, non-tender, non-distended. Bowel sounds present. No organomegaly or mass.  EXTREMITIES: No pedal edema, cyanosis, or clubbing.  NEUROLOGIC: Cranial nerves II through XII are intact. Muscle strength 5/5 in all extremities. Sensation intact. Gait not checked.  PSYCHIATRIC: The patient is alert and oriented x 3.  SKIN: No obvious rash, lesion, or ulcer.  DATA REVIEW:   CBC  Recent Labs Lab 03/10/17 2336  WBC 7.4  HGB 14.3  HCT 41.7  PLT 271    Chemistries   Recent Labs Lab 03/11/17 0936 03/12/17 0357  NA 127* 131*  K 3.3* 4.0  CL 87* 95*  CO2 30 29  GLUCOSE 302* 138*  BUN 21* 22*  CREATININE 0.83 0.76  CALCIUM 9.3 9.2  MG 1.8  --      Microbiology Results  No results found for this or any previous visit.  RADIOLOGY:  No results found.   Management plans discussed with the patient, family and they are in agreement.  CODE STATUS: Full Code   TOTAL TIME TAKING CARE OF THIS PATIENT: 31 minutes.    Shaune Pollack M.D on 03/12/2017 at 11:53 AM  Between 7am to 6pm - Pager - [phone]  After 6pm go to [email or web address] - Scientist, research (life sciences) [facility]  Office  [phone]  CC: Primary care physician; Pricilla Holm, MD   Note: This dictation was prepared with Dragon dictation along with smaller phrase technology. Any transcriptional errors that result from this  process are unintentional.

## 2017-03-12 NOTE — Care Management Note (Signed)
Case Management Note  Patient Details  Name: [name] MRN: [id number] Date of Birth: 01/10/1951  Subjective/Objective:      CM consult for "ETOH detox outpatient" given to Clinical Social Worker per assuming this is a referral for Substance Abuse Rehab.               Action/Plan:   Expected Discharge Date:  03/12/17               Expected Discharge Plan:   03/12/17  In-House Referral:   Consult  Discharge planning Services     Post Acute Care Choice:    Choice offered to:     DME Arranged:    DME Agency:     HH Arranged:    HH Agency:     Status of Service:   Provided CSW with request for ETOH Rehab  If discussed at [employer] Meetings, dates discussed:    Additional Comments:  Lizbet Cirrincione A, RN 03/12/2017, 8:17 AM

## 2017-03-12 NOTE — Discharge Instructions (Signed)
Regular diet for now.  Follow up BMP with PCP. Smoking cessation. Alcohol detox.

## 2017-03-12 NOTE — Progress Notes (Signed)
Patient is alert and oriented and able to verbalize needs. No complaints of pain at this time. Vital signs stable. PIV removed. Discharge instructions gone over with patient at this time. Printed AVS and hard script for Prednisone, Norvasc and Tessalon given to patient. Patient verbalizes understanding of all discharge instructions and follow up care. No concerns voiced at this time. Belongings packed up. Daughter will transport patient home.   Suzan SlickAlison L Mishti Swanton, RN

## 2017-03-12 NOTE — Clinical Social Work Note (Signed)
CSW met with the patient at bedside to discuss options for outpatient alcohol use treatment. The patient denied that she requires such assistance. The CSW was able to provide a resource list for [facility] should the patient change her mind.The patient will discharge home today with transportation from family. The CSW is signing off.  Santiago Bumpers, MSW, Latanya Presser [phone]

## 2017-03-13 LAB — BLOOD GAS, VENOUS
Acid-Base Excess: 3.1 mmol/L — ABNORMAL HIGH (ref 0.0–2.0)
Bicarbonate: 31.2 mmol/L — ABNORMAL HIGH (ref 20.0–28.0)
PATIENT TEMPERATURE: 37
PH VEN: 7.31 (ref 7.250–7.430)
pCO2, Ven: 62 mmHg — ABNORMAL HIGH (ref 44.0–60.0)

## 2017-04-30 ENCOUNTER — Emergency Department: Payer: Medicare Other

## 2017-04-30 ENCOUNTER — Encounter: Payer: Self-pay | Admitting: Emergency Medicine

## 2017-04-30 ENCOUNTER — Inpatient Hospital Stay
Admission: EM | Admit: 2017-04-30 | Discharge: 2017-05-04 | DRG: 190 | Disposition: A | Discharge status: Home / Self Care | Payer: Medicare Other | Attending: Internal Medicine | Admitting: Internal Medicine

## 2017-04-30 DIAGNOSIS — Z6820 Body mass index (BMI) 20.0-20.9, adult: Secondary | ICD-10-CM

## 2017-04-30 DIAGNOSIS — Z7289 Other problems related to lifestyle: Secondary | ICD-10-CM | POA: Diagnosis present

## 2017-04-30 DIAGNOSIS — Z9119 Patient's noncompliance with other medical treatment and regimen: Secondary | ICD-10-CM

## 2017-04-30 DIAGNOSIS — E43 Unspecified severe protein-calorie malnutrition: Secondary | ICD-10-CM | POA: Diagnosis present

## 2017-04-30 DIAGNOSIS — I5022 Chronic systolic (congestive) heart failure: Secondary | ICD-10-CM | POA: Diagnosis present

## 2017-04-30 DIAGNOSIS — Z79899 Other long term (current) drug therapy: Secondary | ICD-10-CM | POA: Diagnosis not present

## 2017-04-30 DIAGNOSIS — I509 Heart failure, unspecified: Secondary | ICD-10-CM

## 2017-04-30 DIAGNOSIS — E875 Hyperkalemia: Secondary | ICD-10-CM | POA: Diagnosis not present

## 2017-04-30 DIAGNOSIS — E871 Hypo-osmolality and hyponatremia: Secondary | ICD-10-CM | POA: Diagnosis present

## 2017-04-30 DIAGNOSIS — F1010 Alcohol abuse, uncomplicated: Secondary | ICD-10-CM | POA: Diagnosis present

## 2017-04-30 DIAGNOSIS — Z9114 Patient's other noncompliance with medication regimen: Secondary | ICD-10-CM

## 2017-04-30 DIAGNOSIS — Z789 Other specified health status: Secondary | ICD-10-CM | POA: Diagnosis present

## 2017-04-30 DIAGNOSIS — Z66 Do not resuscitate: Secondary | ICD-10-CM | POA: Diagnosis not present

## 2017-04-30 DIAGNOSIS — J441 Chronic obstructive pulmonary disease with (acute) exacerbation: Principal | ICD-10-CM | POA: Diagnosis present

## 2017-04-30 DIAGNOSIS — I10 Essential (primary) hypertension: Secondary | ICD-10-CM | POA: Diagnosis present

## 2017-04-30 DIAGNOSIS — I360 Nonrheumatic tricuspid (valve) stenosis: Secondary | ICD-10-CM | POA: Diagnosis not present

## 2017-04-30 DIAGNOSIS — F1090 Alcohol use, unspecified, uncomplicated: Secondary | ICD-10-CM | POA: Diagnosis present

## 2017-04-30 DIAGNOSIS — E876 Hypokalemia: Secondary | ICD-10-CM | POA: Diagnosis present

## 2017-04-30 DIAGNOSIS — I5021 Acute systolic (congestive) heart failure: Secondary | ICD-10-CM | POA: Diagnosis present

## 2017-04-30 DIAGNOSIS — I110 Hypertensive heart disease with heart failure: Secondary | ICD-10-CM | POA: Diagnosis present

## 2017-04-30 DIAGNOSIS — R64 Cachexia: Secondary | ICD-10-CM | POA: Diagnosis present

## 2017-04-30 DIAGNOSIS — R0902 Hypoxemia: Secondary | ICD-10-CM | POA: Diagnosis not present

## 2017-04-30 DIAGNOSIS — F17210 Nicotine dependence, cigarettes, uncomplicated: Secondary | ICD-10-CM | POA: Diagnosis present

## 2017-04-30 DIAGNOSIS — J9601 Acute respiratory failure with hypoxia: Secondary | ICD-10-CM | POA: Diagnosis present

## 2017-04-30 DIAGNOSIS — I447 Left bundle-branch block, unspecified: Secondary | ICD-10-CM | POA: Diagnosis present

## 2017-04-30 DIAGNOSIS — R109 Unspecified abdominal pain: Secondary | ICD-10-CM

## 2017-04-30 DIAGNOSIS — E860 Dehydration: Secondary | ICD-10-CM | POA: Diagnosis present

## 2017-04-30 DIAGNOSIS — I255 Ischemic cardiomyopathy: Secondary | ICD-10-CM | POA: Diagnosis present

## 2017-04-30 HISTORY — DX: Chronic systolic (congestive) heart failure: I50.22

## 2017-04-30 LAB — BASIC METABOLIC PANEL
Anion gap: 10 (ref 5–15)
BUN: 15 mg/dL (ref 6–20)
CHLORIDE: 75 mmol/L — AB (ref 101–111)
CO2: 27 mmol/L (ref 22–32)
CREATININE: 0.87 mg/dL (ref 0.44–1.00)
Calcium: 8.8 mg/dL — ABNORMAL LOW (ref 8.9–10.3)
GFR calc Af Amer: >60 mL/min (ref >60)
GFR calc non Af Amer: >60 mL/min (ref >60)
Glucose, Bld: 157 mg/dL — ABNORMAL HIGH (ref 65–99)
Potassium: 4.5 mmol/L (ref 3.5–5.1)
SODIUM: 112 mmol/L — AB (ref 135–145)

## 2017-04-30 LAB — CBC WITH DIFFERENTIAL/PLATELET
Basophils Absolute: 0 10*3/uL (ref 0–0.1)
Basophils Relative: 0 %
EOS PCT: 0 %
Eosinophils Absolute: 0 10*3/uL (ref 0–0.7)
HCT: 34.4 % — ABNORMAL LOW (ref 35.0–47.0)
HEMOGLOBIN: 12.4 g/dL (ref 12.0–16.0)
LYMPHS ABS: 0.6 10*3/uL — AB (ref 1.0–3.6)
Lymphocytes Relative: 8 %
MCH: 32.8 pg (ref 26.0–34.0)
MCHC: 35.9 g/dL (ref 32.0–36.0)
MCV: 91.3 fL (ref 80.0–100.0)
MONOS PCT: 7 %
Monocytes Absolute: 0.5 10*3/uL (ref 0.2–0.9)
NEUTROS PCT: 85 %
Neutro Abs: 5.5 10*3/uL (ref 1.4–6.5)
Platelets: 254 10*3/uL (ref 150–440)
RBC: 3.77 MIL/uL — AB (ref 3.80–5.20)
RDW: 13.3 % (ref 11.5–14.5)
WBC: 6.6 10*3/uL (ref 3.6–11.0)

## 2017-04-30 LAB — LACTIC ACID, PLASMA: Lactic Acid, Venous: 1.3 mmol/L (ref 0.5–1.9)

## 2017-04-30 LAB — CBC
HCT: 38.3 % (ref 35.0–47.0)
HEMOGLOBIN: 13.3 g/dL (ref 12.0–16.0)
MCH: 31.7 pg (ref 26.0–34.0)
MCHC: 34.8 g/dL (ref 32.0–36.0)
MCV: 91.3 fL (ref 80.0–100.0)
Platelets: 264 10*3/uL (ref 150–440)
RBC: 4.19 MIL/uL (ref 3.80–5.20)
RDW: 13.5 % (ref 11.5–14.5)
WBC: 6.3 10*3/uL (ref 3.6–11.0)

## 2017-04-30 LAB — TROPONIN I: Troponin I: 0.04 ng/mL (ref <0.03)

## 2017-04-30 LAB — BRAIN NATRIURETIC PEPTIDE: B Natriuretic Peptide: 1648 pg/mL — ABNORMAL HIGH (ref 0.0–100.0)

## 2017-04-30 MED ORDER — FOLIC ACID 1 MG PO TABS
1.0000 mg | ORAL_TABLET | Freq: Every day | ORAL | Status: DC
  Administered 2017-05-01 – 2017-05-04 (×5): 1 mg via ORAL
  Filled 2017-04-30 (×5): qty 1

## 2017-04-30 MED ORDER — ENOXAPARIN SODIUM 40 MG/0.4ML ~~LOC~~ SOLN
40.0000 mg | SUBCUTANEOUS | Status: DC
  Filled 2017-04-30 (×2): qty 0.4

## 2017-04-30 MED ORDER — VITAMIN B-1 100 MG PO TABS
100.0000 mg | ORAL_TABLET | Freq: Every day | ORAL | Status: DC
  Administered 2017-05-01 – 2017-05-04 (×5): 100 mg via ORAL
  Filled 2017-04-30 (×5): qty 1

## 2017-04-30 MED ORDER — THIAMINE HCL 100 MG/ML IJ SOLN: 100.0000 mg | Freq: Every day | INTRAMUSCULAR | Status: DC

## 2017-04-30 MED ORDER — ADULT MULTIVITAMIN W/MINERALS CH
1.0000 | ORAL_TABLET | Freq: Every day | ORAL | Status: DC
  Administered 2017-05-01 – 2017-05-04 (×5): 1 via ORAL
  Filled 2017-04-30 (×5): qty 1

## 2017-04-30 MED ORDER — ATENOLOL 25 MG PO TABS
25.0000 mg | ORAL_TABLET | Freq: Every day | ORAL | Status: DC
  Administered 2017-05-01 – 2017-05-03 (×3): 25 mg via ORAL
  Filled 2017-04-30 (×3): qty 1

## 2017-04-30 MED ORDER — LORAZEPAM 1 MG PO TABS: 1.0000 mg | ORAL_TABLET | Freq: Four times a day (QID) | ORAL | Status: AC | PRN

## 2017-04-30 MED ORDER — LORAZEPAM 2 MG/ML IJ SOLN: 1.0000 mg | Freq: Four times a day (QID) | INTRAMUSCULAR | Status: AC | PRN

## 2017-04-30 MED ORDER — ONDANSETRON HCL 4 MG PO TABS: 4.0000 mg | ORAL_TABLET | Freq: Four times a day (QID) | ORAL | Status: DC | PRN

## 2017-04-30 MED ORDER — METHYLPREDNISOLONE SODIUM SUCC 125 MG IJ SOLR
60.0000 mg | INTRAMUSCULAR | Status: DC
  Administered 2017-05-01 – 2017-05-03 (×4): 60 mg via INTRAVENOUS
  Filled 2017-04-30 (×4): qty 2

## 2017-04-30 MED ORDER — ONDANSETRON HCL 4 MG/2ML IJ SOLN: 4.0000 mg | Freq: Four times a day (QID) | INTRAMUSCULAR | Status: DC | PRN

## 2017-04-30 MED ORDER — ASPIRIN 81 MG PO CHEW
324.0000 mg | CHEWABLE_TABLET | Freq: Once | ORAL | Status: AC
  Administered 2017-04-30: 324 mg via ORAL
  Filled 2017-04-30: qty 4

## 2017-04-30 MED ORDER — SENNOSIDES-DOCUSATE SODIUM 8.6-50 MG PO TABS: 1.0000 | ORAL_TABLET | Freq: Every evening | ORAL | Status: DC | PRN

## 2017-04-30 MED ORDER — ALBUTEROL SULFATE (2.5 MG/3ML) 0.083% IN NEBU: 2.5000 mg | INHALATION_SOLUTION | RESPIRATORY_TRACT | Status: DC | PRN

## 2017-04-30 MED ORDER — SODIUM CHLORIDE 0.9 % IV BOLUS (SEPSIS)
1000.0000 mL | Freq: Once | INTRAVENOUS | Status: AC
  Administered 2017-04-30: 1000 mL via INTRAVENOUS

## 2017-04-30 MED ORDER — FUROSEMIDE 10 MG/ML IJ SOLN
20.0000 mg | Freq: Every day | INTRAMUSCULAR | Status: DC
  Administered 2017-05-01 (×2): 20 mg via INTRAVENOUS
  Filled 2017-04-30 (×2): qty 2

## 2017-04-30 MED ORDER — FUROSEMIDE 10 MG/ML IJ SOLN
40.0000 mg | Freq: Once | INTRAMUSCULAR | Status: AC
  Administered 2017-04-30: 40 mg via INTRAVENOUS
  Filled 2017-04-30: qty 4

## 2017-04-30 MED ORDER — ACETAMINOPHEN 325 MG PO TABS: 650.0000 mg | ORAL_TABLET | Freq: Four times a day (QID) | ORAL | Status: DC | PRN

## 2017-04-30 MED ORDER — METHYLPREDNISOLONE SODIUM SUCC 125 MG IJ SOLR
125.0000 mg | Freq: Once | INTRAMUSCULAR | Status: AC
  Administered 2017-04-30: 125 mg via INTRAVENOUS
  Filled 2017-04-30: qty 2

## 2017-04-30 MED ORDER — ACETAMINOPHEN 650 MG RE SUPP: 650.0000 mg | Freq: Four times a day (QID) | RECTAL | Status: DC | PRN

## 2017-04-30 MED ORDER — LISINOPRIL 20 MG PO TABS
20.0000 mg | ORAL_TABLET | Freq: Every day | ORAL | Status: DC
  Administered 2017-05-01 – 2017-05-02 (×2): 20 mg via ORAL
  Filled 2017-04-30 (×2): qty 1

## 2017-04-30 NOTE — ED Notes (Signed)
Date and time results received: 04/30/17 6:22 PM   Test: NA, Trop Critical Value: NA 112, Trop 0.04  Name of Provider Notified: Dr. Sheela StackLorb  Orders Received? Or Actions Taken?: Orders Received - See Orders for details

## 2017-04-30 NOTE — ED Notes (Signed)
Seizure pads placed by this RN.

## 2017-04-30 NOTE — ED Notes (Signed)
Pt taken off bipap for trial off BiPap. Placed on 4L via [place] at this time.

## 2017-04-30 NOTE — H&P (Signed)
[facility] - [place] [facility]   PATIENT NAME: Paige Kelley    MR#:  [id number]  DATE OF BIRTH:  1951/01/22  DATE OF ADMISSION:  04/30/2017  PRIMARY CARE PHYSICIAN: Pricilla HolmSharpe, Leslie M, MD   REQUESTING/REFERRING PHYSICIAN: Dr Shaune PollackLord  CHIEF COMPLAINT:  Increase shortness of breath and weakness  HISTORY OF PRESENT ILLNESS:  Paige Kelley  is a 66 y.o. female with a known history of COPD with ongoing tobacco abuse, history of congestive heart failure, alcohol abuse comes to the emergency room with increasing shortness of breath was found to be hypoxic sets of the lower 80s and placed on BiPAP. Patient's sats remained evaluation is 100%. She's been on BiPAP for a couple hours. She was found to have elevated BNP to 1600. Sodium was 112. Patient admits to drinking 4-5 cans of beer on a daily basis.  She is being admitted for acute hypoxic respiratory failure suspected due to COPD exacerbation and acute congestive heart failure EF unknown with acute hyponatremia.   PAST MEDICAL HISTORY:   Past Medical History:  Diagnosis Date  . Alcohol use   . CHF (congestive heart failure) (HCC)   . Hypertension     PAST SURGICAL HISTOIRY:   Past Surgical History:  Procedure Laterality Date  . NO PAST SURGERIES      SOCIAL HISTORY:   Social History  Substance Use Topics  . Smoking status: Current Every Day Smoker    Packs/day: 1.00    Types: Cigarettes  . Smokeless tobacco: Never Used  . Alcohol use 2.4 oz/week    4 Cans of beer per week    FAMILY HISTORY:   Family History  Problem Relation Age of Onset  . Hypertension Other     DRUG ALLERGIES:  No Known Allergies  REVIEW OF SYSTEMS:  Review of Systems  Constitutional: Negative for chills, fever and weight loss.  HENT: Negative for ear discharge, ear pain and nosebleeds.   Eyes: Negative for blurred vision, pain and discharge.  Respiratory: Positive for shortness of breath. Negative for sputum  production, wheezing and stridor.   Cardiovascular: Negative for chest pain, palpitations, orthopnea and PND.  Gastrointestinal: Positive for nausea. Negative for abdominal pain, diarrhea and vomiting.  Genitourinary: Negative for frequency and urgency.  Musculoskeletal: Negative for back pain and joint pain.  Neurological: Positive for weakness. Negative for sensory change, speech change and focal weakness.  Psychiatric/Behavioral: Negative for depression and hallucinations. The patient is not nervous/anxious.      MEDICATIONS AT HOME:   Prior to Admission medications   Medication Sig Start Date End Date Taking? Authorizing Provider  amLODipine (NORVASC) 10 MG tablet Take 1 tablet (10 mg total) by mouth daily. 03/12/17  Yes Shaune Pollackhen, Qing, MD  atenolol (TENORMIN) 25 MG tablet Take 25 mg by mouth daily. 01/18/17  Yes [provider]  lisinopril (PRINIVIL,ZESTRIL) 20 MG tablet Take 20 mg by mouth daily. 01/12/17  Yes [provider]      VITAL SIGNS:  Blood pressure 120/83, pulse 82, resp. rate (!) 22, height 5\' 7"  (1.702 m), weight 68 kg (150 lb), SpO2 99 %.  PHYSICAL EXAMINATION:  GENERAL:  66 y.o.-year-old patient lying in the bed with no acute distress.  Thin cachectic EYES: Pupils equal, round, reactive to light and accommodation. No scleral icterus. Extraocular muscles intact.  HEENT: Head atraumatic, normocephalic. Oropharynx and nasopharynx clear. BIPAP NECK:  Supple, no jugular venous distention. No thyroid enlargement, no tenderness.  LUNGS:distant breath sounds bilaterally, no  wheezing, rales,rhonchi or crepitation. No use of accessory muscles of respiration.  CARDIOVASCULAR: S1, S2 normal. No murmurs, rubs, or gallops.  ABDOMEN: Soft, nontender, nondistended. Bowel sounds present. No organomegaly or mass.  EXTREMITIES: No pedal edema, cyanosis, or clubbing.  NEUROLOGIC: Cranial nerves II through XII are intact. Muscle strength 5/5 in all extremities. Sensation  intact. Gait not checked.  PSYCHIATRIC: The patient is alert and oriented x 3.  SKIN: No obvious rash, lesion, or ulcer.   LABORATORY PANEL:   CBC  Recent Labs Lab 04/30/17 1744  WBC 6.6  HGB 12.4  HCT 34.4*  PLT 254   ------------------------------------------------------------------------------------------------------------------  Chemistries   Recent Labs Lab 04/30/17 1744  NA 112*  K 4.5  CL 75*  CO2 27  GLUCOSE 157*  BUN 15  CREATININE 0.87  CALCIUM 8.8*   ------------------------------------------------------------------------------------------------------------------  Cardiac Enzymes  Recent Labs Lab 04/30/17 1744  TROPONINI 0.04*   ------------------------------------------------------------------------------------------------------------------  RADIOLOGY:  Dg Chest Port 1 View  Result Date: 04/30/2017 CLINICAL DATA:  COPD EXAM: PORTABLE CHEST 1 VIEW COMPARISON:  03/10/2017 FINDINGS: Cardiac shadow is enlarged. It is somewhat accentuated by the portable technique. The lungs are hyperinflated without focal infiltrate or sizable effusion. No acute bony abnormality is seen. IMPRESSION: COPD without acute abnormality. Electronically Signed   By: Alcide Clever M.D.   On: 04/30/2017 18:05    EKG:    IMPRESSION AND PLAN:   Paige Kelley  is a 66 y.o. female with a known history of COPD with ongoing tobacco abuse, history of congestive heart failure, alcohol abuse comes to the emergency room with increasing shortness of breath was found to be hypoxic sets of the upper80s and placed on BiPAP.     1.Acute hypoxic respiratory failure secondary to COPD exacerbation and acute congestive heart failure EF unknown -Admit to telemetry -Wean BiPAP to nasal cannula oxygen -IV Lasix 20 mg twice a day -Start patient on Coreg 3.125 twice a day -Echo of the heart -Aspirin 81 mg daily  2. COPD exacerbation acute on chronic with ongoing tobacco abuse -IV Solu-Medrol,  nebs, oxygen, inhalers -Patient advised smoking cessation about 4 minutes spent  3. Cachexia with severe malnutrition -Dietitian to see  4. Hyponatremia acute on chronic -Sodium is 112 -IV Lasix 20 twice a day -Nephrology consulted. Patient was seen by Dr. Thedore Mins  5. History of alcohol abuse CIWA protocol  6. DVT prophylaxis subcutaneous Lovenox  Above was discussed with patient's daughter and patient    All the records are reviewed and case discussed with ED provider. Management plans discussed with the patient, family and they are in agreement.  CODE STATUS: Full code   TOTAL CRITICAL TIME TAKING CARE OF THIS PATIENT: 50 minutes.    Hue Steveson M.D on 04/30/2017 at 8:20 PM  Between 7am to 6pm - Pager - [phone]  After 6pm go to [email or web address] - password [email or web address] [facility]  [facility]  Office  [phone]  CC: Primary care physician; Pricilla Holm, MD

## 2017-04-30 NOTE — ED Notes (Signed)
No change in patient condition. Pt's daughter at bedside at this time. Pt denies any needs, pt's daughter denies any needs at this time.

## 2017-04-30 NOTE — ED Notes (Signed)
This RN assisted patient onto the bedpan. Pt had an episode of incontinence after receiving lasix. Pt states that she feels somewhat confused at this time. This RN changed linen so that patient would have clean linen. This RN also placed external urinary catheter due to patient confusion and incontinence episodes. Pt was in agreement.

## 2017-04-30 NOTE — ED Notes (Signed)
MD notified of patient's BNP. Per MD pt's bolus stopped, pt received 300cc's at this time. No change in patient condition, pt remains on BiPap at this time, resting in bed with eyes closed, daughter remains at bedside, O2 99% on Bipap, VSS at this time.

## 2017-04-30 NOTE — Consult Note (Signed)
Date: 04/30/2017                  Patient Name:  Paige Kelley  MRN: [id number]  DOB: 21-Dec-1950  Age / Sex: 66 y.o., female         PCP: Pricilla HolmSharpe, Leslie M, MD                 Service Requesting Consult: Hospitalist/ Enid BaasKalisetti, Radhika, MD                 Reason for Consult: Hyponatremia            History of Present Illness: Patient is a 66 y.o. female with medical problems of COPD, Alcohol, tobacco abuse, CHF who was [facility]30[facility]2017 for evaluation of worsening SOB.  Nephrology consult for evluation of severe Hyponatremia. Patient is currently requiring NIPPV, therefore is not able to provide detailed history. Most of the information is obtained for primary team and patient's chart  Admission Sodium 112 Previous Known Sodium 131 (03/12/2017)    Medications: Outpatient medications:  (Not in a hospital admission)  Current medications: Current Facility-Administered Medications  Medication Dose Route Frequency Provider Last Rate Last Dose  . folic acid (FOLVITE) tablet 1 mg  1 mg Oral Daily Enedina FinnerPatel, Sona, MD      . furosemide (LASIX) injection 20 mg  20 mg Intravenous Daily Enedina FinnerPatel, Sona, MD      . LORazepam (ATIVAN) tablet 1 mg  1 mg Oral Q6H PRN Enedina FinnerPatel, Sona, MD       Or  . LORazepam (ATIVAN) injection 1 mg  1 mg Intravenous Q6H PRN Enedina FinnerPatel, Sona, MD      . methylPREDNISolone sodium succinate (SOLU-MEDROL) 125 mg/2 mL injection 60 mg  60 mg Intravenous Q24H Enedina FinnerPatel, Sona, MD      . multivitamin with minerals tablet 1 tablet  1 tablet Oral Daily Enedina FinnerPatel, Sona, MD      . thiamine (VITAMIN B-1) tablet 100 mg  100 mg Oral Daily Enedina FinnerPatel, Sona, MD       Or  . thiamine (B-1) injection 100 mg  100 mg Intravenous Daily Enedina FinnerPatel, Sona, MD       Current Outpatient Prescriptions  Medication Sig Dispense Refill  . amLODipine (NORVASC) 10 MG tablet Take 1 tablet (10 mg total) by mouth daily. 30 tablet 0  . atenolol (TENORMIN) 25 MG tablet Take 25 mg by mouth daily.    Marland Kitchen. lisinopril  (PRINIVIL,ZESTRIL) 20 MG tablet Take 20 mg by mouth daily.        Allergies: No Known Allergies    Past Medical History: Past Medical History:  Diagnosis Date  . Alcohol use   . CHF (congestive heart failure) (HCC)   . Hypertension      Past Surgical History: Past Surgical History:  Procedure Laterality Date  . NO PAST SURGERIES       Family History: Family History  Problem Relation Age of Onset  . Hypertension Other      Social History: Social History   Social History  . Marital status: Widowed    Spouse name: N/A  . Number of children: N/A  . Years of education: N/A   Occupational History  . Not on file.   Social History Main Topics  . Smoking status: Current Every Day Smoker    Packs/day: 1.00    Types: Cigarettes  . Smokeless tobacco: Never Used  . Alcohol use 2.4 oz/week    4 Cans of beer  per week  . Drug use: No  . Sexual activity: Not on file   Other Topics Concern  . Not on file   Social History Narrative  . No narrative on file     Review of Systems: not reliable due to patient's critical condition Gen:  HEENT:  CV:  Resp:  GI: GU :  MS:  Derm:   Psych: Heme:  Neuro:  Endocrine  Vital Signs: Blood pressure (!) 133/92, pulse 86, resp. rate (!) 29, height 5\' 7"  (1.702 m), weight 68 kg (150 lb), SpO2 99 %.  No intake or output data in the 24 hours ending 04/30/17 2130  Weight trends: Filed Weights   04/30/17 1740  Weight: 68 kg (150 lb)    Physical Exam: General:  critically ill appearing  HEENT CPAP mask in place  Neck:  supple  Lungs: B/l crackles, diminished air movement  Heart::  regular, no rub  Abdomen: Soft, non tender, non distended  Extremities:  + dependent edema  Neurologic: Alert, able to answer a few questions  Skin: No acute rashes             Lab results: Basic Metabolic Panel:  Recent Labs Lab 04/30/17 1744  NA 112*  K 4.5  CL 75*  CO2 27  GLUCOSE 157*  BUN 15  CREATININE 0.87   CALCIUM 8.8*    Liver Function Tests: No results for input(s): AST, ALT, ALKPHOS, BILITOT, PROT, ALBUMIN in the last 168 hours. No results for input(s): LIPASE, AMYLASE in the last 168 hours. No results for input(s): AMMONIA in the last 168 hours.  CBC:  Recent Labs Lab 04/30/17 1744  WBC 6.6  NEUTROABS 5.5  HGB 12.4  HCT 34.4*  MCV 91.3  PLT 254    Cardiac Enzymes:  Recent Labs Lab 04/30/17 1744  TROPONINI 0.04*    BNP: Invalid input(s): POCBNP  CBG: No results for input(s): GLUCAP in the last 168 hours.  Microbiology: No results found for this or any previous visit (from the past 720 hour(s)).   Coagulation Studies: No results for input(s): LABPROT, INR in the last 72 hours.  Urinalysis: No results for input(s): COLORURINE, LABSPEC, PHURINE, GLUCOSEU, HGBUR, BILIRUBINUR, KETONESUR, PROTEINUR, UROBILINOGEN, NITRITE, LEUKOCYTESUR in the last 72 hours.  Invalid input(s): APPERANCEUR      Imaging: Dg Chest Port 1 View  Result Date: 04/30/2017 CLINICAL DATA:  COPD EXAM: PORTABLE CHEST 1 VIEW COMPARISON:  03/10/2017 FINDINGS: Cardiac shadow is enlarged. It is somewhat accentuated by the portable technique. The lungs are hyperinflated without focal infiltrate or sizable effusion. No acute bony abnormality is seen. IMPRESSION: COPD without acute abnormality. Electronically Signed   By: Alcide Clever M.D.   On: 04/30/2017 18:05      Assessment & Plan: Pt is a 66 y.o. caucasian  female with   COPD, Alcohol, tobacco abuse, CHF , was admitted on 04/30/2017 with respiratory distress, hyponatremia.   1. Hyponatremia, acute on chronic 2. Volume overload with acute pulmonary edema and LE edema  Chronic hyponatremia is likely secondary to underlying lung disease (severe COPD) Not sure of the reason for recent worsening of sodium But likely hypervolemic. She also appears to be in volume overload as evidenced by Pulm edema, dependent edema and increased BNP In  addition, patient is regular alcohol drinker with 2-3 (12 oz) beers daily   Recommend: Work up for Liver cirrhosis Agree with iv lasix for diuresis and volume management Alcohol and tobacco cessation counseling Ensure/Boost - dietary supplements  Consider 2 D echo

## 2017-04-30 NOTE — ED Provider Notes (Signed)
[facility] Emergency Department Provider Note ____________________________________________   I have reviewed the triage vital signs and the triage nursing note.  HISTORY  Chief Complaint Respiratory Distress and Weakness   Historian Level V caveat: limited by critical illness respiratory distress  HPI Paige Kelley is a 66 y.o. female with a history of CHF and COPD presents with shortness of breath and dyspnea that been worsening over the past several days. She is extremely dyspneic and arrives by CPAP with EMS. She was reportedly hypoxic. Patient was not tried on home O2 and switch directly over to BiPAP. Patient states that she is feeling better after DuoNeb treatment as well as CPAP.  She thinks is probably her COPD. She does not report increased lower extremity swelling. No lower extremity pain.  Denies chest pain. Denies fevers or coughing.    Past Medical History:  Diagnosis Date  . Alcohol use   . CHF (congestive heart failure) (HCC)   . Hypertension     Patient Active Problem List   Diagnosis Date Noted  . COPD with acute exacerbation (HCC) 03/10/2017  . Alcohol use 03/10/2017  . HTN (hypertension) 03/10/2017  . Hyponatremia 03/10/2017    Past Surgical History:  Procedure Laterality Date  . NO PAST SURGERIES      Prior to Admission medications   Medication Sig Start Date End Date Taking? Authorizing Provider  amLODipine (NORVASC) 10 MG tablet Take 1 tablet (10 mg total) by mouth daily. 03/12/17  Yes Shaune Pollack, MD  atenolol (TENORMIN) 25 MG tablet Take 25 mg by mouth daily. 01/18/17  Yes [provider]  lisinopril (PRINIVIL,ZESTRIL) 20 MG tablet Take 20 mg by mouth daily. 01/12/17  Yes [provider]  benzonatate (TESSALON) 100 MG capsule Take 1 capsule (100 mg total) by mouth 3 (three) times daily as needed for cough. Patient not taking: Reported on 04/30/2017 03/12/17   Shaune Pollack, MD  predniSONE (DELTASONE) 10 MG  tablet 40 mg po daily for 2 days, 20 mg po daily for 2 days, 10 mg po daily for 2 days. Patient not taking: Reported on 04/30/2017 03/12/17   Shaune Pollack, MD    No Known Allergies  Family History  Problem Relation Age of Onset  . Hypertension Other     Social History Social History  Substance Use Topics  . Smoking status: Current Every Day Smoker    Packs/day: 1.00    Types: Cigarettes  . Smokeless tobacco: Never Used  . Alcohol use 2.4 oz/week    4 Cans of beer per week    Review of Systems  Constitutional: Negative for fever. Eyes: Negative for visual changes. ENT: Negative for sore throat. Cardiovascular: Negative for chest pain. Respiratory: Positive for shortness of breath. Gastrointestinal: Negative for abdominal pain, vomiting and diarrhea. Genitourinary: Negative for dysuria. Musculoskeletal: Negative for back pain. Skin: Negative for rash. Neurological: Negative for headache.  ____________________________________________   PHYSICAL EXAM:  VITAL SIGNS: ED Triage Vitals  Enc Vitals Group     BP 04/30/17 1738 133/82     Pulse Rate 04/30/17 1738 97     Resp 04/30/17 1738 (!) 33     Temp --      Temp Source 04/30/17 1738 Oral     SpO2 04/30/17 1738 98 %     Weight 04/30/17 1740 150 lb (68 kg)     Height 04/30/17 1740 5\' 7"  (1.702 m)     Head Circumference --      Peak Flow --  Pain Score 04/30/17 1737 0     Pain Loc --      Pain Edu? --      Excl. in GC? --      Constitutional: Alert and Cooperative. She is having left upper distress and is currently on BiPAP. HEENT   Head: Normocephalic and atraumatic.      Eyes: Conjunctivae are normal. Pupils equal and round.       Ears:         Nose: No congestion/rhinnorhea.   Mouth/Throat: Mucous membranes are moist.   Neck: No stridor. Cardiovascular/Chest: Normal rate, regular rhythm.  No murmurs, rubs, or gallops. Respiratory: No retractions. On BiPAP.  Mild decreased air movement throughout,  but mostly rhonchi/rales throughout all fields. No active wheezing. Gastrointestinal: Soft. No distention, no guarding, no rebound. Nontender.    Genitourinary/rectal:Deferred Musculoskeletal: Nontender with normal range of motion in all extremities. No joint effusions.  No lower extremity tenderness.  No edema. Neurologic:  Patient is cooperative and interactive. No facial droop. Speech evaluation is limited because she is on the BiPAP. No gross or focal neurologic deficits are appreciated. Skin:  Skin is warm, dry and intact. No rash noted. Psychiatric: No agitation. ____________________________________________  LABS (pertinent positives/negatives)  Labs Reviewed  BASIC METABOLIC PANEL - Abnormal; Notable for the following:       Result Value   Sodium 112 (*)    Chloride 75 (*)    Glucose, Bld 157 (*)    Calcium 8.8 (*)    All other components within normal limits  CBC WITH DIFFERENTIAL/PLATELET - Abnormal; Notable for the following:    RBC 3.77 (*)    HCT 34.4 (*)    Lymphs Abs 0.6 (*)    All other components within normal limits  TROPONIN I - Abnormal; Notable for the following:    Troponin I 0.04 (*)    All other components within normal limits  BRAIN NATRIURETIC PEPTIDE - Abnormal; Notable for the following:    B Natriuretic Peptide 1,648.0 (*)    All other components within normal limits  BLOOD GAS, VENOUS - Abnormal; Notable for the following:    pO2, Ven 69.0 (*)    Bicarbonate 30.1 (*)    Acid-Base Excess 3.7 (*)    All other components within normal limits  CULTURE, BLOOD (ROUTINE X 2)  CULTURE, BLOOD (ROUTINE X 2)  LACTIC ACID, PLASMA  LACTIC ACID, PLASMA    ____________________________________________    EKG I, Governor Rooksebecca Ronson Hagins, MD, the attending physician have personally viewed and interpreted all ECGs.  94 bpm. Normal sinus rhythm. Left axis deviation. Left bundle branch block. LVH with repolarization changes. Nonspecific ST and T-wave. Minimal ST segment  elevation in V3 and V4, looks to be J-point elevation. ____________________________________________  RADIOLOGY All Xrays were viewed by me. Imaging interpreted by Radiologist.  CXR:  IMPRESSION: COPD without acute abnormality. __________________________________________  PROCEDURES  Procedure(s) performed: None  Critical Care performed: CRITICAL CARE Performed by: Governor Rooksebecca Braylie Badami   Total critical care time: 45 minutes  Critical care time was exclusive of separately billable procedures and treating other patients.  Critical care was necessary to treat or prevent imminent or life-threatening deterioration.  Critical care was time spent personally by me on the following activities: development of treatment plan with patient and/or surrogate as well as nursing, discussions with consultants, evaluation of patient's response to treatment, examination of patient, obtaining history from patient or surrogate, ordering and performing treatments and interventions, ordering and review of laboratory studies,  ordering and review of radiographic studies, pulse oximetry and re-evaluation of patient's condition.   ____________________________________________   ED COURSE / ASSESSMENT AND PLAN  Pertinent labs & imaging results that were available during my care of the patient were reviewed by me and considered in my medical decision making (see chart for details).    Ms. Margaret Pyle arrived with respiratory distress, but more comfortable on CPAP and she was swishing medially to BiPAP. Patient is still with rhonchi and rales. Patient states she had significant improvement with the CPAP with EMS as well as DuoNeb. Patient states she felt this was more COPD. Patient was given a Medrol.  Chest x-ray was without significant finding, certainly no focal infiltrate.  BNP is significantly elevated, I think her symptoms are more consistent with CHF component on top of COPD exacerbation. She is also  significantly hyponatremic, however adding additional fluids when she started tenuous in her respiratory status I think it is risky.  Patient did receive about 500 cc of fluids and then was stopped upon receipt of evaluation showing more concern for CHF. I find the patient was started on Lasix.  Patient is not having any confusion or other acute symptoms from the hyponatremia. When I review her previous sodium levels, she has previous been in the 120s.  Discussed with hospitalist for admission. Updated family, daughter at the bedside.    CONSULTATIONS:  Hospitalist for admission, Dr. Allena Katz.   Patient / Family / Caregiver informed of clinical course, medical decision-making process, and agree with plan.  ___________________________________________   FINAL CLINICAL IMPRESSION(S) / ED DIAGNOSES   Final diagnoses:  Hyponatremia  Acute congestive heart failure, unspecified heart failure type (HCC)  Acute respiratory failure with hypoxia (HCC)              Note: This dictation was prepared with Dragon dictation. Any transcriptional errors that result from this process are unintentional    Governor Rooks, MD 04/30/17 1932

## 2017-04-30 NOTE — ED Notes (Signed)
This RN and Ashlynn, EDT cleaned patient up of incontinence after patient urinated around external urinary catheter. Clean linen provided.

## 2017-05-01 ENCOUNTER — Inpatient Hospital Stay: Payer: Medicare Other

## 2017-05-01 ENCOUNTER — Inpatient Hospital Stay (HOSPITAL_COMMUNITY)
Admit: 2017-05-01 | Discharge: 2017-05-01 | Disposition: A | Discharge status: Home / Self Care | Payer: Medicare Other | Attending: Internal Medicine | Admitting: Internal Medicine

## 2017-05-01 DIAGNOSIS — I360 Nonrheumatic tricuspid (valve) stenosis: Secondary | ICD-10-CM

## 2017-05-01 LAB — BASIC METABOLIC PANEL
ANION GAP: 10 (ref 5–15)
ANION GAP: 13 (ref 5–15)
Anion gap: 11 (ref 5–15)
BUN: 15 mg/dL (ref 6–20)
BUN: 16 mg/dL (ref 6–20)
BUN: 20 mg/dL (ref 6–20)
CALCIUM: 8.9 mg/dL (ref 8.9–10.3)
CALCIUM: 9 mg/dL (ref 8.9–10.3)
CALCIUM: 9.1 mg/dL (ref 8.9–10.3)
CHLORIDE: 77 mmol/L — AB (ref 101–111)
CO2: 29 mmol/L (ref 22–32)
CO2: 33 mmol/L — AB (ref 22–32)
CO2: 33 mmol/L — AB (ref 22–32)
Chloride: 77 mmol/L — ABNORMAL LOW (ref 101–111)
Chloride: 75 mmol/L — ABNORMAL LOW (ref 101–111)
Creatinine, Ser: 0.67 mg/dL (ref 0.44–1.00)
Creatinine, Ser: 0.68 mg/dL (ref 0.44–1.00)
Creatinine, Ser: 0.7 mg/dL (ref 0.44–1.00)
GFR calc non Af Amer: >60 mL/min (ref >60)
GFR calc non Af Amer: >60 mL/min (ref >60)
Glucose, Bld: 127 mg/dL — ABNORMAL HIGH (ref 65–99)
Glucose, Bld: 142 mg/dL — ABNORMAL HIGH (ref 65–99)
Glucose, Bld: 200 mg/dL — ABNORMAL HIGH (ref 65–99)
Potassium: 3.3 mmol/L — ABNORMAL LOW (ref 3.5–5.1)
Potassium: 3.8 mmol/L (ref 3.5–5.1)
Potassium: 4 mmol/L (ref 3.5–5.1)
SODIUM: 120 mmol/L — AB (ref 135–145)
SODIUM: 121 mmol/L — AB (ref 135–145)
Sodium: 117 mmol/L — CL (ref 135–145)

## 2017-05-01 LAB — LACTIC ACID, PLASMA: LACTIC ACID, VENOUS: 1 mmol/L (ref 0.5–1.9)

## 2017-05-01 LAB — TSH: TSH: 1.235 u[IU]/mL (ref 0.350–4.500)

## 2017-05-01 MED ORDER — POTASSIUM CHLORIDE IN NACL 20-0.9 MEQ/L-% IV SOLN
INTRAVENOUS | Status: DC
  Administered 2017-05-01: 14:00:00 via INTRAVENOUS
  Filled 2017-05-01 (×2): qty 1000

## 2017-05-01 NOTE — Evaluation (Signed)
Physical Therapy Evaluation Patient Details Name: Paige Kelley MRN: [id number] DOB: Nov 01, 1950 Today's Date: 05/01/2017   History of Present Illness  Pt is a 66 y.o.femalewith medical problems of COPD, Alcohol, tobacco abuse, CHF who was admitted to [facility] on 7/15/2018for evaluation of worsening SOB. Nephrology consult for evluation of severe Hyponatremia.  Assessment includes: acute hypoxic resp failure secondary to COPD exacerbation adn acute CHF, cachexia with severe malnutrition, hyponatremia, and h/o alcohol abuse.    Clinical Impression  Pt's Na at 120 with Dr. Nemiah Commander stating OK to proceed with PT services at that level.  Pt presents with minor deficits in strength, transfers, gait, and balance and moderate to severe deficits in activity tolerance.    Pt Ind with bed mobility tasks and SBA with sit to/from stand transfers.  Pt CGA with amb with RW.  Mod verbal cues for amb closer to RW for safety with pt fatigued after amb 15-20' and requiring seated rest.  SpO2 97% with HR 70 bpm after amb on 4LO2/min compared to baselines of 97% and 70 bpm.  No c/o SOB or other symptoms.  Pt will benefit from HHPT services upon discharge to address above deficits for decreased caregiver assistance and safe return towards PLOF.        Follow Up Recommendations Home health PT    Equipment Recommendations  Rolling walker with 5" wheels    Recommendations for Other Services       Precautions / Restrictions Precautions Precautions: Fall Restrictions Weight Bearing Restrictions: No      Mobility  Bed Mobility Overal bed mobility: Independent                Transfers Overall transfer level: Needs assistance Equipment used: Rolling walker (2 wheeled) Transfers: Sit to/from Stand Sit to Stand: Supervision         General transfer comment: Good effort with sit to/from stand with good stability upon initial stand  Ambulation/Gait Ambulation/Gait assistance: Min  guard Ambulation Distance (Feet): 20 Feet Assistive device: Rolling walker (2 wheeled) Gait Pattern/deviations: Step-through pattern;Decreased step length - right;Decreased step length - left;Trunk flexed   Gait velocity interpretation: Below normal speed for age/gender General Gait Details: Mod verbal cues for amb closer to RW for safety with pt fatigued after amb 15-20'.  SpO2 97% with HR 70 bpm after amb on 4LO2/min compared to baselines of 97% and 70 bpm.  No c/o SOB or other symptoms.    Stairs Stairs:  (Deferred)          Wheelchair Mobility    Modified Rankin (Stroke Patients Only)       Balance Overall balance assessment: Needs assistance Sitting-balance support: No upper extremity supported;Feet supported Sitting balance-Leahy Scale: Good     Standing balance support: Bilateral upper extremity supported Standing balance-Leahy Scale: Good                               Pertinent Vitals/Pain Pain Assessment: No/denies pain    Home Living Family/patient expects to be discharged to:: Private residence Living Arrangements: Children Available Help at Discharge: Family;Available 24 hours/day Type of Home: House Home Access: Stairs to enter Entrance Stairs-Rails: Right Entrance Stairs-Number of Steps: 3 Home Layout: One level Home Equipment: None      Prior Function Level of Independence: Independent         Comments: Ind amb limited community distances without AD with no fall history, Ind with ADLs  Hand Dominance   Dominant Hand: Right    Extremity/Trunk Assessment        Lower Extremity Assessment Lower Extremity Assessment: Generalized weakness       Communication   Communication: No difficulties  Cognition Arousal/Alertness: Awake/alert Behavior During Therapy: WFL for tasks assessed/performed Overall Cognitive Status: Within Functional Limits for tasks assessed                                         General Comments      Exercises Total Joint Exercises Ankle Circles/Pumps: AROM;Both;10 reps Heel Slides: AROM;Both;5 reps Hip ABduction/ADduction: AROM;Both;5 reps Long Arc Quad: AROM;Both;10 reps Knee Flexion: AROM;Both;10 reps Marching in Standing: AROM;Both;10 reps (Marching in sitting)   Assessment/Plan    PT Assessment Patient needs continued PT services  PT Problem List Decreased strength;Decreased activity tolerance;Decreased balance;Decreased knowledge of use of DME       PT Treatment Interventions DME instruction;Gait training;Stair training;Functional mobility training;Neuromuscular re-education;Balance training;Therapeutic exercise;Therapeutic activities;Patient/family education    PT Goals (Current goals can be found in the Care Plan section)  Acute Rehab PT Goals Patient Stated Goal: To return home and be able to do more without feeling tired PT Goal Formulation: With patient Time For Goal Achievement: 05/14/17 Potential to Achieve Goals: Good    Frequency Min 2X/week   Barriers to discharge        Co-evaluation               AM-PAC PT "6 Clicks" Daily Activity  Outcome Measure Difficulty turning over in bed (including adjusting bedclothes, sheets and blankets)?: None Difficulty moving from lying on back to sitting on the side of the bed? : None Difficulty sitting down on and standing up from a chair with arms (e.g., wheelchair, bedside commode, etc,.)?: None Help needed moving to and from a bed to chair (including a wheelchair)?: A Little Help needed walking in hospital room?: A Little Help needed climbing 3-5 steps with a railing? : A Lot 6 Click Score: 20    End of Session Equipment Utilized During Treatment: Gait belt;Oxygen Activity Tolerance: Patient limited by fatigue Patient left: in bed;with bed alarm set;with call bell/phone within reach Nurse Communication: Mobility status PT Visit Diagnosis: Difficulty in walking, not elsewhere  classified (R26.2);Muscle weakness (generalized) (M62.81)    Time: 1020-1055 PT Time Calculation (min) (ACUTE ONLY): 35 min   Charges:   PT Evaluation $PT Eval Low Complexity: 1 Procedure PT Treatments $Therapeutic Exercise: 8-22 mins   PT G Codes:        Elly Modena. Scott Bow Buntyn PT, DPT 05/01/17, 11:44 AM

## 2017-05-01 NOTE — Progress Notes (Signed)
[facility] [place] [facility]   PATIENT NAME: Paige CornfieldWanda Subramanian    MR#:  [id number]  DATE OF BIRTH:  08-Oct-1951  SUBJECTIVE:  CHIEF COMPLAINT:   Chief Complaint  Patient presents with  . Respiratory Distress  . Weakness   - Came in with shortness of breath, noted to be hypoxic and weak. Sodium was noted to be 112. -Still has weakness. Breathing is improved. Off BiPAP and on 4L nasal cannula  REVIEW OF SYSTEMS:  Review of Systems  Constitutional: Positive for malaise/fatigue. Negative for chills and fever.  HENT: Negative for congestion, ear discharge, hearing loss and nosebleeds.   Eyes: Negative for blurred vision and double vision.  Respiratory: Negative for cough, shortness of breath and wheezing.   Cardiovascular: Negative for chest pain, palpitations and leg swelling.  Gastrointestinal: Negative for abdominal pain, constipation, diarrhea, nausea and vomiting.  Genitourinary: Negative for dysuria.  Musculoskeletal: Negative for myalgias.  Neurological: Positive for dizziness. Negative for sensory change, speech change, focal weakness, seizures and headaches.    DRUG ALLERGIES:  No Known Allergies  VITALS:  Blood pressure (!) 147/93, pulse 70, temperature 98.5 F (36.9 C), temperature source Oral, resp. rate 18, height 5\' 7"  (1.702 m), weight 58.9 kg (129 lb 14.4 oz), SpO2 97 %.  PHYSICAL EXAMINATION:  Physical Exam  GENERAL:  66 y.o.-year-old patient lying in the bed with no acute distress.  EYES: Pupils equal, round, reactive to light and accommodation. No scleral icterus. Extraocular muscles intact. Pale conjunctiva HEENT: Head atraumatic, normocephalic. Oropharynx and nasopharynx clear.  NECK:  Supple, no jugular venous distention. No thyroid enlargement, no tenderness.  LUNGS: scant breath sounds bilaterally, no wheezing, rales,rhonchi or crepitation. No use of accessory muscles of respiration. Decreased bibasilar breath  sounds CARDIOVASCULAR: S1, S2 normal. No murmurs, rubs, or gallops.  ABDOMEN: Soft, nontender, nondistended. Bowel sounds present. No organomegaly or mass.  EXTREMITIES: No pedal edema, cyanosis, or clubbing.  NEUROLOGIC: Cranial nerves II through XII are intact. Muscle strength 5/5 in all extremities. Sensation intact. Gait not checked. Global weakness noted. PSYCHIATRIC: The patient is alert and oriented x 3.  SKIN: No obvious rash, lesion, or ulcer.    LABORATORY PANEL:   CBC  Recent Labs Lab 04/30/17 2342  WBC 6.3  HGB 13.3  HCT 38.3  PLT 264   ------------------------------------------------------------------------------------------------------------------  Chemistries   Recent Labs Lab 05/01/17 1121  NA 121*  K 3.3*  CL 75*  CO2 33*  GLUCOSE 200*  BUN 20  CREATININE 0.70  CALCIUM 8.9   ------------------------------------------------------------------------------------------------------------------  Cardiac Enzymes  Recent Labs Lab 04/30/17 1744  TROPONINI 0.04*   ------------------------------------------------------------------------------------------------------------------  RADIOLOGY:  Dg Chest Port 1 View  Result Date: 04/30/2017 CLINICAL DATA:  COPD EXAM: PORTABLE CHEST 1 VIEW COMPARISON:  03/10/2017 FINDINGS: Cardiac shadow is enlarged. It is somewhat accentuated by the portable technique. The lungs are hyperinflated without focal infiltrate or sizable effusion. No acute bony abnormality is seen. IMPRESSION: COPD without acute abnormality. Electronically Signed   By: Alcide CleverMark  Lukens M.D.   On: 04/30/2017 18:05   [place] Abdomen Limited Ruq  Result Date: 05/01/2017 CLINICAL DATA:  66 year old female with abdominal pain. EXAM: ULTRASOUND ABDOMEN LIMITED RIGHT UPPER QUADRANT COMPARISON:  Chest CTA 03/10/2017. FINDINGS: Gallbladder: Contracted. Mild generalized gallbladder wall thickening (3-4 mm). Several nonshadowing echogenic foci in the fundus measuring  about 4 mm diameter might be small stones or polyps (image 9). No pericholecystic fluid. No sonographic Murphy sign elicited. No shadowing stones. Common bile duct:  Diameter: 3 mm , normal Liver: No focal lesion identified. Within normal limits in parenchymal echogenicity. Other findings: Negative visible right kidney. IMPRESSION: 1. Partially contracted gallbladder with several subcentimeter nonshadowing stones versus fundal polyps (the latter would be inconsequential). No strong evidence of acute cholecystitis. 2. No evidence of biliary obstruction. Normal ultrasound appearance of the liver. Electronically Signed   By: Odessa Fleming M.D.   On: 05/01/2017 08:09    EKG:   Orders placed or performed during the hospital encounter of 03/10/17  . ED EKG  . ED EKG  . EKG    ASSESSMENT AND PLAN:   Razia Screws  is a 66 y.o. female with a known history of COPD with ongoing tobacco abuse, history of congestive heart failure, alcohol abuse comes to the emergency room with increasing shortness of breath was found to be hypoxic and noted to have sodium of 112  #1 Acute hyponatremia- beer potomania and dehydration -Received Lasix yesterday. Not fluid overloaded. We will discontinue Lasix. -Echo is pending. Gentle hydration ordered. -Also hypokalemia-being replaced -Nephrology consulted  #2 Acute hypoxic respiratory failure- secondary to COPD exacerbation - required Bipap initially, off now and on 4L - wean as tolerated, not on home o2 - continue IV steroids - nebs  #3 HTN- atenolol and lisinopril  #4 Alcohol abuse- counseled, ativan prn- CIWA protocol  #5 DVT prophylaxis- lovenox  Appreciate PT input, will need home health at discharge   All the records are reviewed and case discussed with Care Management/Social Workerr. Management plans discussed with the patient, family and they are in agreement.  CODE STATUS: Full Code  TOTAL TIME TAKING CARE OF THIS PATIENT: 38 minutes.   POSSIBLE D/C IN  1-2 DAYS, DEPENDING ON CLINICAL CONDITION.   Wilkes Potvin M.D on 05/01/2017 at 1:20 PM  Between 7am to 6pm - Pager - [phone]  After 6pm go to [email or web address] - Social research officer, government  [facility]  Office  [phone]  CC: Primary care physician; Pricilla Holm, MD

## 2017-05-01 NOTE — Progress Notes (Signed)
[facility]  ROUNDING NOTE   Subjective:  Serum sodium has improved to 121 however still overall quite low. Both Lasix and IV fluids have been stopped now. Serum potassium slightly low at 3.3.   Objective:  Vital signs in last 24 hours:  Temp:  [97.9 F (36.6 C)-98.5 F (36.9 C)] 98.5 F (36.9 C) (07/16 0902) Pulse Rate:  [70-112] 70 (07/16 1132) Resp:  [18-33] 18 (07/16 0902) BP: (110-147)/(74-97) 147/93 (07/16 0902) SpO2:  [93 %-100 %] 97 % (07/16 1132) FiO2 (%):  [100 %] 100 % (07/15 1738) Weight:  [58.9 kg (129 lb 14.4 oz)-68 kg (150 lb)] 58.9 kg (129 lb 14.4 oz) (07/16 0503)  Weight change:  Filed Weights   04/30/17 1740 04/30/17 2344 05/01/17 0503  Weight: 68 kg (150 lb) 60.2 kg (132 lb 12.8 oz) 58.9 kg (129 lb 14.4 oz)    Intake/Output: I/O last 3 completed shifts: In: 300 [IV Piggyback:300] Out: 1100 [Urine:1100]   Intake/Output this shift:  No intake/output data recorded.  Physical Exam: General: No acute distress  Head: Normocephalic, atraumatic. Moist oral mucosal membranes  Eyes: Anicteric  Neck: Supple, trachea midline  Lungs:  Clear to auscultation, normal effort  Heart: S1S2 no rubs  Abdomen:  Soft, nontender, bowel sounds present  Extremities:  peripheral edema.  Neurologic: Awake, alert, following commands  Skin: No lesions       Basic Metabolic Panel:  Recent Labs Lab 04/30/17 1744 04/30/17 2342 05/01/17 0528 05/01/17 1121  NA 112* 117* 120* 121*  K 4.5 4.0 3.8 3.3*  CL 75* 77* 77* 75*  CO2 27 29 33* 33*  GLUCOSE 157* 127* 142* 200*  BUN 15 15 16 20   CREATININE 0.87 0.68 0.67 0.70  CALCIUM 8.8* 9.0 9.1 8.9    Liver Function Tests: No results for input(s): AST, ALT, ALKPHOS, BILITOT, PROT, ALBUMIN in the last 168 hours. No results for input(s): LIPASE, AMYLASE in the last 168 hours. No results for input(s): AMMONIA in the last 168 hours.  CBC:  Recent Labs Lab 04/30/17 1744 04/30/17 2342  WBC 6.6 6.3   NEUTROABS 5.5  --   HGB 12.4 13.3  HCT 34.4* 38.3  MCV 91.3 91.3  PLT 254 264    Cardiac Enzymes:  Recent Labs Lab 04/30/17 1744  TROPONINI 0.04*    BNP: Invalid input(s): POCBNP  CBG: No results for input(s): GLUCAP in the last 168 hours.  Microbiology: Results for orders placed or performed during the hospital encounter of 04/30/17  Culture, blood (routine x 2)     Status: None (Preliminary result)   Collection Time: 04/30/17  5:43 PM  Result Value Ref Range Status   Specimen Description BLOOD BLOOD RIGHT FOREARM  Final   Special Requests   Final    BOTTLES DRAWN AEROBIC AND ANAEROBIC Blood Culture adequate volume   Culture NO GROWTH < 24 HOURS  Final   Report Status PENDING  Incomplete  Culture, blood (routine x 2)     Status: None (Preliminary result)   Collection Time: 04/30/17  5:50 PM  Result Value Ref Range Status   Specimen Description BLOOD BLOOD LEFT FOREARM  Final   Special Requests   Final    BOTTLES DRAWN AEROBIC AND ANAEROBIC Blood Culture adequate volume   Culture NO GROWTH < 24 HOURS  Final   Report Status PENDING  Incomplete    Coagulation Studies: No results for input(s): LABPROT, INR in the last 72 hours.  Urinalysis: No results for input(s): COLORURINE,  LABSPEC, PHURINE, GLUCOSEU, HGBUR, BILIRUBINUR, KETONESUR, PROTEINUR, UROBILINOGEN, NITRITE, LEUKOCYTESUR in the last 72 hours.  Invalid input(s): APPERANCEUR    Imaging: Dg Chest Port 1 View  Result Date: 04/30/2017 CLINICAL DATA:  COPD EXAM: PORTABLE CHEST 1 VIEW COMPARISON:  03/10/2017 FINDINGS: Cardiac shadow is enlarged. It is somewhat accentuated by the portable technique. The lungs are hyperinflated without focal infiltrate or sizable effusion. No acute bony abnormality is seen. IMPRESSION: COPD without acute abnormality. Electronically Signed   By: Alcide CleverMark  Lukens M.D.   On: 04/30/2017 18:05   [place] Abdomen Limited Ruq  Result Date: 05/01/2017 CLINICAL DATA:  66 year old female with  abdominal pain. EXAM: ULTRASOUND ABDOMEN LIMITED RIGHT UPPER QUADRANT COMPARISON:  Chest CTA 03/10/2017. FINDINGS: Gallbladder: Contracted. Mild generalized gallbladder wall thickening (3-4 mm). Several nonshadowing echogenic foci in the fundus measuring about 4 mm diameter might be small stones or polyps (image 9). No pericholecystic fluid. No sonographic Murphy sign elicited. No shadowing stones. Common bile duct: Diameter: 3 mm , normal Liver: No focal lesion identified. Within normal limits in parenchymal echogenicity. Other findings: Negative visible right kidney. IMPRESSION: 1. Partially contracted gallbladder with several subcentimeter nonshadowing stones versus fundal polyps (the latter would be inconsequential). No strong evidence of acute cholecystitis. 2. No evidence of biliary obstruction. Normal ultrasound appearance of the liver. Electronically Signed   By: Odessa FlemingH  Hall M.D.   On: 05/01/2017 08:09     Medications:   . 0.9 % NaCl with KCl 20 mEq / L 50 mL/hr at 05/01/17 1414   . atenolol  25 mg Oral Daily  . enoxaparin (LOVENOX) injection  40 mg Subcutaneous Q24H  . folic acid  1 mg Oral Daily  . lisinopril  20 mg Oral Daily  . methylPREDNISolone (SOLU-MEDROL) injection  60 mg Intravenous Q24H  . multivitamin with minerals  1 tablet Oral Daily  . thiamine  100 mg Oral Daily   Or  . thiamine  100 mg Intravenous Daily   acetaminophen **OR** acetaminophen, albuterol, LORazepam **OR** LORazepam, ondansetron **OR** ondansetron (ZOFRAN) IV, senna-docusate  Assessment/ Plan:  66 y.o. female with   COPD, Alcohol, tobacco abuse, CHF , was admitted on 04/30/2017 with respiratory distress, hyponatremia.   1. Hyponatremia, acute on chronic 2. Volume overload with acute pulmonary edema and LE edema  Plan:  It appears that the patient's hyponatremia has significantly improved and is currently up to 121.  In addition it appears that the patient's volume status has improved as well.  Agree with  stopping Lasix at this point in time.  The patient has been resumed on 0.9 normal saline at 50 cc per hour.  Continue to monitor her volume status closely.  In addition we will continue to monitor serum sodium.  Further plan as patient progresses.   LOS: 1 Anoop Hemmer 7/16/20183:20 PM

## 2017-05-01 NOTE — Progress Notes (Signed)
Pt. Arrived via stretcher, transferred to bed by staff.  Pt.  Alert ad oriented x4. CIWA negative. No signs or c/o SOB, pain or acute distress noted. General room oriteation given. Instruction on how to use ascom and call bell given. External catheter applied draining clear yellow urine. Fall risk assessment sheet explained and signed. Pt. Given sandwich tray and soda.

## 2017-05-01 NOTE — Progress Notes (Signed)
Skin assessed with Claris Gowerharlotte, RN and CCMD notified of tele box placement with Daniell, CNA,

## 2017-05-01 NOTE — Progress Notes (Signed)
Lab called with critical NA of 117. MD aware

## 2017-05-01 NOTE — Care Management (Signed)
Order for CM consult.  Patient presents from home with hyponatremia and exac copd.  Initially required continuous bipap but has now been weaned to 4 iters nasal cannula.  Need for oxygen is acute.  Discussed during progression of need for home 02 assessment.  Patient denies issues accessing medical care, obtaining medications or issues with transportation.  Daughter lives with her.  Patient admits to "not bathing ike I should and that upsets my daughter."  Patient says she is "just too tired to take a bath.  Says she does not have alcohol addiction and drinks 3 beers a day. "I use to drink a lot more.' Declines need to receive written aocholol cessation resources. She is agreeable to home health services at discharge and no agency preference. Verbalizes understanding that she may need continuous oxygen at discharge but hopes she will not need this

## 2017-05-02 LAB — ECHOCARDIOGRAM COMPLETE
AVPHT: 707 ms
Area-P 1/2: 8.8 cm2
CHL CUP MV DEC (S): 85
EERAT: 20.31
EWDT: 85 ms
FS: 14 % — AB (ref 28–44)
HEIGHTINCHES: 67 in
IV/PV OW: 1.13
LA diam index: 2.7 cm/m2
LA vol A4C: 78.2 ml
LA vol index: 48 mL/m2
LA vol: 79.9 mL
LASIZE: 45 mm
LEFT ATRIUM END SYS DIAM: 45 mm
LV E/e'average: 20.31
LV SIMPSON'S DISK: 24
LV dias vol: 177 mL — AB (ref 46–106)
LV sys vol index: 80 mL/m2
LV sys vol: 134 mL — AB (ref 14–42)
LVDIAVOLIN: 106 mL/m2
LVEEMED: 20.31
LVELAT: 5.76 cm/s
MV pk A vel: 82 m/s
MV pk E vel: 117 m/s
MVPG: 5 mmHg
MVSPHT: 25 ms
PW: 9.71 mm — AB (ref 0.6–1.1)
RV LATERAL S' VELOCITY: 8.27 cm/s
Stroke v: 43 ml
TAPSE: 20.7 mm
TDI e' lateral: 5.76
TDI e' medial: 4.22
VTI: 158 cm
WEIGHTICAEL: 2078.4 [oz_av]

## 2017-05-02 LAB — VITAMIN B12: VITAMIN B 12: 317 pg/mL (ref 180–914)

## 2017-05-02 LAB — BASIC METABOLIC PANEL
Anion gap: 7 (ref 5–15)
BUN: 25 mg/dL — ABNORMAL HIGH (ref 6–20)
CHLORIDE: 84 mmol/L — AB (ref 101–111)
CO2: 35 mmol/L — ABNORMAL HIGH (ref 22–32)
Calcium: 8.8 mg/dL — ABNORMAL LOW (ref 8.9–10.3)
Creatinine, Ser: 0.7 mg/dL (ref 0.44–1.00)
GFR calc non Af Amer: >60 mL/min (ref >60)
Glucose, Bld: 140 mg/dL — ABNORMAL HIGH (ref 65–99)
POTASSIUM: 4.4 mmol/L (ref 3.5–5.1)
SODIUM: 126 mmol/L — AB (ref 135–145)

## 2017-05-02 MED ORDER — SODIUM CHLORIDE 0.9 % IV SOLN
INTRAVENOUS | Status: DC
  Administered 2017-05-02 – 2017-05-03 (×2): via INTRAVENOUS

## 2017-05-02 NOTE — Progress Notes (Signed)
[facility] [place] [facility]   PATIENT NAME: Paige CornfieldWanda Kelley    MR#:  [id number]  DATE OF BIRTH:  08-08-1951  SUBJECTIVE:  CHIEF COMPLAINT:   Chief Complaint  Patient presents with  . Respiratory Distress  . Weakness   - still needing 3L o2, sodium improving to 126 - feels weak  REVIEW OF SYSTEMS:  Review of Systems  Constitutional: Positive for malaise/fatigue. Negative for chills and fever.  HENT: Negative for congestion, ear discharge, hearing loss and nosebleeds.   Eyes: Negative for blurred vision and double vision.  Respiratory: Negative for cough, shortness of breath and wheezing.   Cardiovascular: Negative for chest pain, palpitations and leg swelling.  Gastrointestinal: Negative for abdominal pain, constipation, diarrhea, nausea and vomiting.  Genitourinary: Negative for dysuria.  Musculoskeletal: Negative for myalgias.  Neurological: Positive for dizziness. Negative for sensory change, speech change, focal weakness, seizures and headaches.    DRUG ALLERGIES:  No Known Allergies  VITALS:  Blood pressure (!) 125/58, pulse 97, temperature (!) 97.5 F (36.4 C), temperature source Oral, resp. rate 16, height 5\' 7"  (1.702 m), weight 58.9 kg (129 lb 14.4 oz), SpO2 (!) 89 %.  PHYSICAL EXAMINATION:  Physical Exam  GENERAL:  66 y.o.-year-old patient lying in the bed with no acute distress.  EYES: Pupils equal, round, reactive to light and accommodation. No scleral icterus. Extraocular muscles intact. Pale conjunctiva HEENT: Head atraumatic, normocephalic. Oropharynx and nasopharynx clear.  NECK:  Supple, no jugular venous distention. No thyroid enlargement, no tenderness.  LUNGS: scant breath sounds bilaterally, no wheezing, rales,rhonchi or crepitation. No use of accessory muscles of respiration. Decreased bibasilar breath sounds CARDIOVASCULAR: S1, S2 normal. No murmurs, rubs, or gallops.  ABDOMEN: Soft, nontender, nondistended. Bowel sounds  present. No organomegaly or mass.  EXTREMITIES: No pedal edema, cyanosis, or clubbing.  NEUROLOGIC: Cranial nerves II through XII are intact. Muscle strength 5/5 in all extremities. Sensation intact. Gait not checked. Global weakness noted. PSYCHIATRIC: The patient is alert and oriented x 3.  SKIN: No obvious rash, lesion, or ulcer.    LABORATORY PANEL:   CBC  Recent Labs Lab 04/30/17 2342  WBC 6.3  HGB 13.3  HCT 38.3  PLT 264   ------------------------------------------------------------------------------------------------------------------  Chemistries   Recent Labs Lab 05/02/17 0536  NA 126*  K 4.4  CL 84*  CO2 35*  GLUCOSE 140*  BUN 25*  CREATININE 0.70  CALCIUM 8.8*   ------------------------------------------------------------------------------------------------------------------  Cardiac Enzymes  Recent Labs Lab 04/30/17 1744  TROPONINI 0.04*   ------------------------------------------------------------------------------------------------------------------  RADIOLOGY:  Dg Chest Port 1 View  Result Date: 04/30/2017 CLINICAL DATA:  COPD EXAM: PORTABLE CHEST 1 VIEW COMPARISON:  03/10/2017 FINDINGS: Cardiac shadow is enlarged. It is somewhat accentuated by the portable technique. The lungs are hyperinflated without focal infiltrate or sizable effusion. No acute bony abnormality is seen. IMPRESSION: COPD without acute abnormality. Electronically Signed   By: Alcide CleverMark  Lukens M.D.   On: 04/30/2017 18:05   [place] Abdomen Limited Ruq  Result Date: 05/01/2017 CLINICAL DATA:  66 year old female with abdominal pain. EXAM: ULTRASOUND ABDOMEN LIMITED RIGHT UPPER QUADRANT COMPARISON:  Chest CTA 03/10/2017. FINDINGS: Gallbladder: Contracted. Mild generalized gallbladder wall thickening (3-4 mm). Several nonshadowing echogenic foci in the fundus measuring about 4 mm diameter might be small stones or polyps (image 9). No pericholecystic fluid. No sonographic Murphy sign elicited.  No shadowing stones. Common bile duct: Diameter: 3 mm , normal Liver: No focal lesion identified. Within normal limits in parenchymal echogenicity. Other findings:  Negative visible right kidney. IMPRESSION: 1. Partially contracted gallbladder with several subcentimeter nonshadowing stones versus fundal polyps (the latter would be inconsequential). No strong evidence of acute cholecystitis. 2. No evidence of biliary obstruction. Normal ultrasound appearance of the liver. Electronically Signed   By: Odessa Fleming M.D.   On: 05/01/2017 08:09    EKG:   Orders placed or performed during the hospital encounter of 03/10/17  . ED EKG  . ED EKG  . EKG    ASSESSMENT AND PLAN:   Paige Kelley  is a 66 y.o. female with a known history of COPD with ongoing tobacco abuse, history of congestive heart failure, alcohol abuse comes to the emergency room with increasing shortness of breath was found to be hypoxic and noted to have sodium of 112  #1 Acute hyponatremia- beer potomania and dehydration -Received Lasix on admission. Not fluid overloaded. discontinued Lasix. -Echo is pending. Gentle hydration ordered. Stop fluids in AM -Also hypokalemia-replaced -Nephrology consult appreciated  #2 Acute hypoxic respiratory failure- secondary to COPD exacerbation - required Bipap initially, off now and on 3L - wean as tolerated, not on home o2 - continue IV steroids - nebs  #3 HTN- atenolol and lisinopril  #4 Alcohol abuse- counseled, ativan prn- CIWA protocol  #5 DVT prophylaxis- lovenox  Appreciate PT input, will need home health at discharge   All the records are reviewed and case discussed with Care Management/Social Workerr. Management plans discussed with the patient, family and they are in agreement.  CODE STATUS: Full Code  TOTAL TIME TAKING CARE OF THIS PATIENT: 38 minutes.   POSSIBLE D/C IN 1-2 DAYS, DEPENDING ON CLINICAL CONDITION.   Paige Kelley M.D on 05/02/2017 at 1:52 PM  Between  7am to 6pm - Pager - [phone]  After 6pm go to [email or web address] - Social research officer, government  [facility]  Office  [phone]  CC: Primary care physician; Paige Holm, MD

## 2017-05-02 NOTE — Progress Notes (Signed)
*  PRELIMINARY RESULTS* Echocardiogram 2D Echocardiogram has been performed.  Cristela BlueHege, Janautica Netzley 05/02/2017, 8:57 AM

## 2017-05-02 NOTE — Progress Notes (Signed)
[facility]  ROUNDING NOTE   Subjective:  Serum sodium is currently up to 126. Patient states that she was only eating one meal per day at home.   Objective:  Vital signs in last 24 hours:  Temp:  [97.5 F (36.4 C)-98.5 F (36.9 C)] 97.5 F (36.4 C) (07/17 0517) Pulse Rate:  [85-97] 97 (07/17 0933) Resp:  [16-18] 16 (07/17 0517) BP: (110-125)/(58-95) 125/58 (07/17 0933) SpO2:  [89 %-94 %] 89 % (07/17 0933)  Weight change:  Filed Weights   04/30/17 1740 04/30/17 2344 05/01/17 0503  Weight: 68 kg (150 lb) 60.2 kg (132 lb 12.8 oz) 58.9 kg (129 lb 14.4 oz)    Intake/Output: I/O last 3 completed shifts: In: 878.3 [P.O.:240; I.V.:638.3] Out: 1700 [Urine:1700]   Intake/Output this shift:  Total I/O In: 600 [P.O.:600] Out: -   Physical Exam: General: No acute distress  Head: Normocephalic, atraumatic. Moist oral mucosal membranes  Eyes: Anicteric  Neck: Supple, trachea midline  Lungs:  Scattered rhonchi, normal effort  Heart: S1S2 no rubs  Abdomen:  Soft, nontender, bowel sounds present  Extremities: No peripheral edema.  Neurologic: Awake, alert, following commands  Skin: No lesions       Basic Metabolic Panel:  Recent Labs Lab 04/30/17 1744 04/30/17 2342 05/01/17 0528 05/01/17 1121 05/02/17 0536  NA 112* 117* 120* 121* 126*  K 4.5 4.0 3.8 3.3* 4.4  CL 75* 77* 77* 75* 84*  CO2 27 29 33* 33* 35*  GLUCOSE 157* 127* 142* 200* 140*  BUN 15 15 16 20  25*  CREATININE 0.87 0.68 0.67 0.70 0.70  CALCIUM 8.8* 9.0 9.1 8.9 8.8*    Liver Function Tests: No results for input(s): AST, ALT, ALKPHOS, BILITOT, PROT, ALBUMIN in the last 168 hours. No results for input(s): LIPASE, AMYLASE in the last 168 hours. No results for input(s): AMMONIA in the last 168 hours.  CBC:  Recent Labs Lab 04/30/17 1744 04/30/17 2342  WBC 6.6 6.3  NEUTROABS 5.5  --   HGB 12.4 13.3  HCT 34.4* 38.3  MCV 91.3 91.3  PLT 254 264    Cardiac Enzymes:  Recent  Labs Lab 04/30/17 1744  TROPONINI 0.04*    BNP: Invalid input(s): POCBNP  CBG: No results for input(s): GLUCAP in the last 168 hours.  Microbiology: Results for orders placed or performed during the hospital encounter of 04/30/17  Culture, blood (routine x 2)     Status: None (Preliminary result)   Collection Time: 04/30/17  5:43 PM  Result Value Ref Range Status   Specimen Description BLOOD BLOOD RIGHT FOREARM  Final   Special Requests   Final    BOTTLES DRAWN AEROBIC AND ANAEROBIC Blood Culture adequate volume   Culture NO GROWTH 2 DAYS  Final   Report Status PENDING  Incomplete  Culture, blood (routine x 2)     Status: None (Preliminary result)   Collection Time: 04/30/17  5:50 PM  Result Value Ref Range Status   Specimen Description BLOOD BLOOD LEFT FOREARM  Final   Special Requests   Final    BOTTLES DRAWN AEROBIC AND ANAEROBIC Blood Culture adequate volume   Culture NO GROWTH 2 DAYS  Final   Report Status PENDING  Incomplete    Coagulation Studies: No results for input(s): LABPROT, INR in the last 72 hours.  Urinalysis: No results for input(s): COLORURINE, LABSPEC, PHURINE, GLUCOSEU, HGBUR, BILIRUBINUR, KETONESUR, PROTEINUR, UROBILINOGEN, NITRITE, LEUKOCYTESUR in the last 72 hours.  Invalid input(s): APPERANCEUR    Imaging:  Dg Chest Port 1 View  Result Date: 04/30/2017 CLINICAL DATA:  COPD EXAM: PORTABLE CHEST 1 VIEW COMPARISON:  03/10/2017 FINDINGS: Cardiac shadow is enlarged. It is somewhat accentuated by the portable technique. The lungs are hyperinflated without focal infiltrate or sizable effusion. No acute bony abnormality is seen. IMPRESSION: COPD without acute abnormality. Electronically Signed   By: Alcide Clever M.D.   On: 04/30/2017 18:05   [place] Abdomen Limited Ruq  Result Date: 05/01/2017 CLINICAL DATA:  66 year old female with abdominal pain. EXAM: ULTRASOUND ABDOMEN LIMITED RIGHT UPPER QUADRANT COMPARISON:  Chest CTA 03/10/2017. FINDINGS: Gallbladder:  Contracted. Mild generalized gallbladder wall thickening (3-4 mm). Several nonshadowing echogenic foci in the fundus measuring about 4 mm diameter might be small stones or polyps (image 9). No pericholecystic fluid. No sonographic Murphy sign elicited. No shadowing stones. Common bile duct: Diameter: 3 mm , normal Liver: No focal lesion identified. Within normal limits in parenchymal echogenicity. Other findings: Negative visible right kidney. IMPRESSION: 1. Partially contracted gallbladder with several subcentimeter nonshadowing stones versus fundal polyps (the latter would be inconsequential). No strong evidence of acute cholecystitis. 2. No evidence of biliary obstruction. Normal ultrasound appearance of the liver. Electronically Signed   By: Odessa Fleming M.D.   On: 05/01/2017 08:09     Medications:   . sodium chloride 50 mL/hr at 05/02/17 0934   . atenolol  25 mg Oral Daily  . enoxaparin (LOVENOX) injection  40 mg Subcutaneous Q24H  . folic acid  1 mg Oral Daily  . lisinopril  20 mg Oral Daily  . methylPREDNISolone (SOLU-MEDROL) injection  60 mg Intravenous Q24H  . multivitamin with minerals  1 tablet Oral Daily  . thiamine  100 mg Oral Daily   Or  . thiamine  100 mg Intravenous Daily   acetaminophen **OR** acetaminophen, albuterol, LORazepam **OR** LORazepam, ondansetron **OR** ondansetron (ZOFRAN) IV, senna-docusate  Assessment/ Plan:  66 y.o. female with   COPD, Alcohol, tobacco abuse, CHF , was admitted on 04/30/2017 with respiratory distress, hyponatremia.   1. Hyponatremia, acute on chronic 2. Volume overload with acute pulmonary edema and LE edema upon admission, currently appears euvolemic.   Plan:  The patient's serum sodium has improved and is currently 126.  She was placed on IV fluid hydration after her initial volume overload resolved. Continue to monitor serum sodium trend.  I stressed the importance of proper nutritionto the patient today.  We also counseled the patient on  tobacco cessation today.  Further plan as patient progresses.   LOS: 2 Shaivi Rothschild 7/17/20184:31 PM

## 2017-05-02 NOTE — Progress Notes (Signed)
Physical Therapy Treatment Patient Details Name: Paige HollingsheadWanda Sue Wadding MRN: [id number] DOB: May 07, 1951 Today's Date: 05/02/2017    History of Present Illness Pt is a 66 y.o.femalewith medical problems of COPD, Alcohol, tobacco abuse, CHF who was [facility]2018for evaluation of worsening SOB. Nephrology consult for evluation of severe Hyponatremia.  Assessment includes: acute hypoxic resp failure secondary to COPD exacerbation adn acute CHF, cachexia with severe malnutrition, hyponatremia, and h/o alcohol abuse.    PT Comments    Pt in bed, agrees to session.  She was able to get to edge of bed with rails without assist.  Sitting balance good.  She stood and was able to ambulate 30' x 2 with walker and mod verbal cues to increase overall safety with walker.  She frequently keeps walker too far out in front of her with flexed posture and does not correct despite cues.  Upon return to recliner, she stated she needed to use the bathroom and transferred to/from commode with min a x 1.  She had increased difficulty with transfer without use of walker.    O2 93% on room air P 74 at rest.  Monitored throughout session and remained in 90's with 3 lpm O2 with gait.      Follow Up Recommendations  Home health PT     Equipment Recommendations  Rolling walker with 5" wheels    Recommendations for Other Services       Precautions / Restrictions Precautions Precautions: Fall Restrictions Weight Bearing Restrictions: No    Mobility  Bed Mobility Overal bed mobility: Independent                Transfers Overall transfer level: Needs assistance Equipment used: Rolling walker (2 wheeled) Transfers: Sit to/from Stand Sit to Stand: Supervision;Min guard         General transfer comment: Did well sit to stand with walker, has more difficulty when stand pivot trnsfer to/from commode without walker.  Ambulation/Gait Ambulation/Gait assistance: Min guard Ambulation  Distance (Feet): 30 Feet Assistive device: Rolling walker (2 wheeled) Gait Pattern/deviations: Step-through pattern   Gait velocity interpretation: Below normal speed for age/gender General Gait Details: Generally steady with walker but overall poor use of walker for gait - keeps walker too far out with flexed posture despite verbal and tactile cues to correct this am.   Stairs            Wheelchair Mobility    Modified Rankin (Stroke Patients Only)       Balance Overall balance assessment: Needs assistance Sitting-balance support: No upper extremity supported;Feet supported Sitting balance-Leahy Scale: Good     Standing balance support: Bilateral upper extremity supported Standing balance-Leahy Scale: Good                              Cognition Arousal/Alertness: Awake/alert Behavior During Therapy: WFL for tasks assessed/performed Overall Cognitive Status: Within Functional Limits for tasks assessed                                        Exercises Other Exercises Other Exercises: to bedside commode to void    General Comments        Pertinent Vitals/Pain Pain Assessment: No/denies pain    Home Living  Prior Function            PT Goals (current goals can now be found in the care plan section) Progress towards PT goals: Progressing toward goals    Frequency    Min 2X/week      PT Plan      Co-evaluation              AM-PAC PT "6 Clicks" Daily Activity  Outcome Measure  Difficulty turning over in bed (including adjusting bedclothes, sheets and blankets)?: None Difficulty moving from lying on back to sitting on the side of the bed? : None Difficulty sitting down on and standing up from a chair with arms (e.g., wheelchair, bedside commode, etc,.)?: A Little Help needed moving to and from a bed to chair (including a wheelchair)?: A Little Help needed walking in hospital room?: A  Little Help needed climbing 3-5 steps with a railing? : A Lot 6 Click Score: 19    End of Session Equipment Utilized During Treatment: Gait belt;Oxygen Activity Tolerance: Patient limited by fatigue Patient left: in chair;with chair alarm set;with call bell/phone within reach         Time: 1001-1033 PT Time Calculation (min) (ACUTE ONLY): 32 min  Charges:  $Gait Training: 8-22 mins $Therapeutic Activity: 8-22 mins                    G Codes:      Danielle Dess, PTA 05/02/17, 10:52 AM

## 2017-05-03 ENCOUNTER — Inpatient Hospital Stay: Payer: Medicare Other

## 2017-05-03 ENCOUNTER — Encounter: Payer: Self-pay | Admitting: Physician Assistant

## 2017-05-03 DIAGNOSIS — I10 Essential (primary) hypertension: Secondary | ICD-10-CM

## 2017-05-03 DIAGNOSIS — J9601 Acute respiratory failure with hypoxia: Secondary | ICD-10-CM

## 2017-05-03 DIAGNOSIS — I5021 Acute systolic (congestive) heart failure: Secondary | ICD-10-CM | POA: Diagnosis present

## 2017-05-03 DIAGNOSIS — Z789 Other specified health status: Secondary | ICD-10-CM

## 2017-05-03 DIAGNOSIS — E871 Hypo-osmolality and hyponatremia: Secondary | ICD-10-CM

## 2017-05-03 DIAGNOSIS — R0902 Hypoxemia: Secondary | ICD-10-CM

## 2017-05-03 DIAGNOSIS — J441 Chronic obstructive pulmonary disease with (acute) exacerbation: Principal | ICD-10-CM

## 2017-05-03 LAB — BASIC METABOLIC PANEL
Anion gap: 5 (ref 5–15)
BUN: 28 mg/dL — ABNORMAL HIGH (ref 6–20)
CHLORIDE: 87 mmol/L — AB (ref 101–111)
CO2: 36 mmol/L — AB (ref 22–32)
CREATININE: 0.82 mg/dL (ref 0.44–1.00)
Calcium: 8.7 mg/dL — ABNORMAL LOW (ref 8.9–10.3)
GFR calc non Af Amer: >60 mL/min (ref >60)
Glucose, Bld: 122 mg/dL — ABNORMAL HIGH (ref 65–99)
Potassium: 5.2 mmol/L — ABNORMAL HIGH (ref 3.5–5.1)
Sodium: 128 mmol/L — ABNORMAL LOW (ref 135–145)

## 2017-05-03 LAB — HEPATIC FUNCTION PANEL
ALBUMIN: 3.2 g/dL — AB (ref 3.5–5.0)
ALK PHOS: 43 U/L (ref 38–126)
ALT: 41 U/L (ref 14–54)
AST: 36 U/L (ref 15–41)
BILIRUBIN TOTAL: 0.4 mg/dL (ref 0.3–1.2)
Bilirubin, Direct: <0.1 mg/dL — ABNORMAL LOW (ref 0.1–0.5)
Total Protein: 5.7 g/dL — ABNORMAL LOW (ref 6.5–8.1)

## 2017-05-03 LAB — POTASSIUM: Potassium: 4.4 mmol/L (ref 3.5–5.1)

## 2017-05-03 LAB — MAGNESIUM
MAGNESIUM: 2 mg/dL (ref 1.7–2.4)
Magnesium: 1.9 mg/dL (ref 1.7–2.4)

## 2017-05-03 MED ORDER — SODIUM CHLORIDE 0.9% FLUSH
3.0000 mL | Freq: Two times a day (BID) | INTRAVENOUS | Status: DC
  Administered 2017-05-03: 3 mL via INTRAVENOUS

## 2017-05-03 MED ORDER — IOPAMIDOL (ISOVUE-370) INJECTION 76%
75.0000 mL | Freq: Once | INTRAVENOUS | Status: AC | PRN
  Administered 2017-05-03: 75 mL via INTRAVENOUS

## 2017-05-03 MED ORDER — CARVEDILOL 3.125 MG PO TABS
3.1250 mg | ORAL_TABLET | Freq: Two times a day (BID) | ORAL | Status: DC
  Administered 2017-05-03 – 2017-05-04 (×2): 3.125 mg via ORAL
  Filled 2017-05-03 (×2): qty 1

## 2017-05-03 MED ORDER — FUROSEMIDE 10 MG/ML IJ SOLN
40.0000 mg | Freq: Once | INTRAMUSCULAR | Status: AC
  Administered 2017-05-03: 40 mg via INTRAVENOUS
  Filled 2017-05-03: qty 4

## 2017-05-03 NOTE — Consult Note (Signed)
Cardiology Consultation Note  Patient ID: Paige Kelley, MRN: [id number], DOB/AGE: 1950-11-30 66 y.o. Admit date: 04/30/2017   Date of Consult: 05/03/2017 Primary Physician: Pricilla Holm, MD Primary Cardiologist: New to [facility] - consult by Mariah Milling Requesting Physician: Dr. Nemiah Commander, MD  Chief Complaint: SOB Reason for Consult: Acute systolic CHF vs acute on chronic systolic CHF  HPI: Paige Kelley is a 66 y.o. female who is being seen today for the evaluation of acute systolic CHF Versus acute on chronic systolic CHF at the request of Dr. Nemiah Commander, MD. Patient has a h/o possible chronic systolic CHF though details are unknown, ongoing heavy alcohol abuse for numerous years, ongoing tobacco abuse, noncompliance, and hypertension who was admitted to [facility] on 7/15 with increased shortness of breath.  Patient was previously living in [place] when she was admitted in approximately 2010 for CHF exacerbation. Patient declines having cardiac catheterization done at that time. It is unknown what her testing showed as records are not available for review and she does not recall what hospital she was at. Review of at the care everywhere in [facility] does not indicate the patient has been seen at a health care facility associated with Epic. Patient was previously admitted to [facility] in May 2018 for acute COPD exacerbation along with electrolyte abnormalities including chronic hypo-hyponatremia and hypokalemia. She was treated for her COPD exacerbation accordingly and her electrolytes were repleted. Patient indicates she drinks approximately 4-5 beers per day, though family indicates patient drinks a significant amount more than what the patient reports and has done so for many years.  Prior to admission, patient had noted increasing shortness of breath without chest pain or productive cough. She had continued with her alcohol drinking and had minimal by mouth intake. She reports a weight loss  of approximately 50 pounds over the past 6-12 months.  Upon the patient's arrival to [facility] they were found to be hypoxic with oxygen saturations in the lower 80s requiring BiPAP initially now weaned to nasal cannula. BP 110/72, HR 85 bpm, temp 97.5, oxygen saturation 94% on nasal cannula, weight not taken at admission. EKG showed NSR, 94 bpm, LBBB, CXR showed COPD. RUQ abdominal ultrasound showed partially contracted gallbladder with non-shadowing stones, no evidence of acute cholecystitis. Labs showed an initial troponin of 0.04 (not trended), BNP 1648 (BNP 1 month prior of 195), white blood cell count 6.6, hemoglobin 12.4, platelet count 254, lactic acid 1.3 blood culture negative to date, TSH normal, sodium 112 that has improved to 128 at current time, serum creatinine 0.68, potassium 4.0 and has trended to 5.2 currently, albumin 3.2. Echocardiogram on 05/02/17 showed EF 20-25%, unable to exclude regional wall motion abnormalities, grade 2 diastolic dysfunction, mild atrial regurgitation, moderate to severe mitral regurgitation, mild by atrial enlargement, moderate to severe tricuspid regurgitation, moderately elevated right-sided pressure. No prior to compare to. Patient received IV Lasix upon admission followed by IV fluids and another dose of IV Lasix earlier this morning, given mild hyperkalemia. Renal has recommended avoiding diuretics at this time with treatment of her hyperkalemia via Kayexalate or Veltassa. IV fluids have been held. Appetite continues to be decreased. No chest pain.  Past Medical History:  Diagnosis Date  . Alcohol use   . Chronic systolic CHF (congestive heart failure) (HCC)    a. TTE 04/2017: EF 20-25%, unable to exclude RWMA, GR2DD, mild AI, severe MR, mild biatrial enlargement, mod-sev TR, moderately elevated PASP  . Hypertension  Most Recent Cardiac Studies: TTE 05/01/2017: Study Conclusions  - Left ventricle: The cavity size was mildly dilated. Wall   thickness  was increased in a pattern of mild LVH. Systolic   function was severely reduced. The estimated ejection fraction   was in the range of 20% to 25%. Regional wall motion   abnormalities cannot be excluded. Features are consistent with a   pseudonormal left ventricular filling pattern, with concomitant   abnormal relaxation and increased filling pressure (grade 2   diastolic dysfunction). Doppler parameters are consistent with   high ventricular filling pressure. - Aortic valve: There was mild regurgitation. - Mitral valve: There was severe regurgitation. - Left atrium: The atrium was mildly dilated. - Right ventricle: The cavity size was mildly dilated. Systolic   function was mildly to moderately reduced. - Tricuspid valve: There was moderate-severe regurgitation. - Pulmonary arteries: Systolic pressure was moderately increased.   Surgical History:  Past Surgical History:  Procedure Laterality Date  . NO PAST SURGERIES       Home Meds: Prior to Admission medications   Medication Sig Start Date End Date Taking? Authorizing Provider  amLODipine (NORVASC) 10 MG tablet Take 1 tablet (10 mg total) by mouth daily. 03/12/17  Yes Shaune Pollack, MD  atenolol (TENORMIN) 25 MG tablet Take 25 mg by mouth daily. 01/18/17  Yes [provider]  lisinopril (PRINIVIL,ZESTRIL) 20 MG tablet Take 20 mg by mouth daily. 01/12/17  Yes [provider]    Inpatient Medications:  . atenolol  25 mg Oral Daily  . enoxaparin (LOVENOX) injection  40 mg Subcutaneous Q24H  . folic acid  1 mg Oral Daily  . methylPREDNISolone (SOLU-MEDROL) injection  60 mg Intravenous Q24H  . multivitamin with minerals  1 tablet Oral Daily  . thiamine  100 mg Oral Daily   Or  . thiamine  100 mg Intravenous Daily     Allergies: No Known Allergies  Social History   Social History  . Marital status: Widowed    Spouse name: N/A  . Number of children: N/A  . Years of education: N/A   Occupational History  .  Not on file.   Social History Main Topics  . Smoking status: Current Every Day Smoker    Packs/day: 1.00    Types: Cigarettes  . Smokeless tobacco: Never Used  . Alcohol use 2.4 oz/week    4 Cans of beer per week  . Drug use: No  . Sexual activity: Not on file   Other Topics Concern  . Not on file   Social History Narrative  . No narrative on file     Family History  Problem Relation Age of Onset  . Hypertension Other      Review of Systems: Review of Systems  Constitutional: Positive for malaise/fatigue. Negative for chills, diaphoresis, fever and weight loss.  HENT: Negative for congestion.   Eyes: Negative for discharge and redness.  Respiratory: Positive for cough and shortness of breath. Negative for hemoptysis, sputum production and wheezing.   Cardiovascular: Negative for chest pain, palpitations, orthopnea, claudication, leg swelling and PND.  Gastrointestinal: Negative for abdominal pain, blood in stool, heartburn, melena, nausea and vomiting.  Genitourinary: Negative for hematuria.  Musculoskeletal: Negative for falls and myalgias.  Skin: Negative for rash.  Neurological: Positive for weakness. Negative for dizziness, tingling, tremors, sensory change, speech change, focal weakness and loss of consciousness.  Endo/Heme/Allergies: Does not bruise/bleed easily.  Psychiatric/Behavioral: Positive for substance abuse. The patient is not nervous/anxious.  All other systems reviewed and are negative.   Labs:  Recent Labs  04/30/17 1744  TROPONINI 0.04*   Lab Results  Component Value Date   WBC 6.3 04/30/2017   HGB 13.3 04/30/2017   HCT 38.3 04/30/2017   MCV 91.3 04/30/2017   PLT 264 04/30/2017     Recent Labs Lab 05/03/17 0335  NA 128*  K 5.2*  CL 87*  CO2 36*  BUN 28*  CREATININE 0.82  CALCIUM 8.7*  PROT 5.7*  BILITOT 0.4  ALKPHOS 43  ALT 41  AST 36  GLUCOSE 122*   No results found for: CHOL, HDL, LDLCALC, TRIG No results found for:  DDIMER  Radiology/Studies:  Dg Chest Port 1 View  Result Date: 04/30/2017 IMPRESSION: COPD without acute abnormality. Electronically Signed   By: Alcide Clever M.D.   On: 04/30/2017 18:05   US Abdomen Limited Ruq  Result Date: 05/01/2017 IMPRESSION: 1. Partially contracted gallbladder with several subcentimeter nonshadowing stones versus fundal polyps (the latter would be inconsequential). No strong evidence of acute cholecystitis. 2. No evidence of biliary obstruction. Normal ultrasound appearance of the liver. Electronically Signed   By: Odessa Fleming M.D.   On: 05/01/2017 08:09    EKG: Interpreted by me showed: NSR, 94 bpm, LBBB Telemetry: Interpreted by me showed: NSR  Weights: Filed Weights   04/30/17 1740 04/30/17 2344 05/01/17 0503  Weight: 150 lb (68 kg) 132 lb 12.8 oz (60.2 kg) 129 lb 14.4 oz (58.9 kg)     Physical Exam: Blood pressure 110/60, pulse 85, temperature 98.1 F (36.7 C), temperature source Oral, resp. rate 16, height 5\' 7"  (1.702 m), weight 129 lb 14.4 oz (58.9 kg), SpO2 92 %. Body mass index is 20.35 kg/m. General: Well developed, well nourished, in no acute distress. Head: Normocephalic, atraumatic, sclera non-icteric, no xanthomas, nares are without discharge.  Neck: Negative for carotid bruits. JVD not elevated. Lungs: diminished and coarse breath sounds bilaterally. Breathing is unlabored. Heart: RRR with S1 S2. II/VI systolic murmur, no rubs, or gallops appreciated. Abdomen: Soft, non-tender, non-distended with normoactive bowel sounds. No hepatomegaly. No rebound/guarding. No obvious abdominal masses. Msk:  Strength and tone appear normal for age. Extremities: No clubbing or cyanosis. No edema. Distal pedal pulses are 2+ and equal bilaterally. Neuro: Alert and oriented X 3. No facial asymmetry. No focal deficit. Moves all extremities spontaneously. Psych:  Responds to questions appropriately with a normal affect.    Assessment and Plan:  Principal  Problem:   Acute respiratory failure with hypoxia (HCC) Active Problems:   COPD with acute exacerbation (HCC)   Alcohol use   Hyponatremia   Acute systolic CHF (congestive heart failure) (HCC)   HTN (hypertension)    1. Acute respiratory failure with hypoxia: -Likely multifactorial including CHF exacerbation and COPD exacerbation -Wean oxygen as able  2. Acute versus acute on chronic systolic CHF: -Baseline EF is unknown. Patient and patient's younger brother indicate a prior diagnosis of congestive heart failure while patient was living in [place]. Patient declines having prior heart catheterization though does indicate remote stress testing years ago. No records in Epic or care everywhere for review. -Renal has recommended avoiding diuretics at this point -She does not appear grossly volume overloaded at this time -It was discussed with the patient in detail that she may need ischemic evaluation given her cardiomyopathy area she would like to decide and discussed with her family if she would prefer  Cardiac catheterization versus nuclear stress testing.  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 Hyponatremia: -Improved  3. Hypokalemia: -Improved -Monitor uptrending potassium  4. COPD exacerbation: -Per IM  5. Alcohol abuse: -CIWA protocol -Check magnesium and replete to goal of 2.0  as indicated  6.  Dispo: - Discuss goals of care with patient, she no longer feels like she would like to be a full code -This was discussed with IM who will go back and discuss further and update orders as indicated    Signed, Carola FrostRyan Reily Treloar, PA-C [facility] Pager: [phone] 05/03/2017, 10:34 AM

## 2017-05-03 NOTE — Progress Notes (Signed)
[employer] [facility] [place] [facility]   PATIENT NAME: Paige Kelley    MR#:  [id number]  DATE OF BIRTH:  [birth year]  SUBJECTIVE:  CHIEF COMPLAINT:   Chief Complaint  Patient presents with  . Respiratory Distress  . Weakness   - More alert now. Occasional incontinent episodes. -Received 1 dose of Lasix this morning. Fluids are stopped. -Still on 3 L oxygen.  REVIEW OF SYSTEMS:  Review of Systems  Constitutional: Positive for malaise/fatigue. Negative for chills and fever.  HENT: Negative for congestion, ear discharge, hearing loss and nosebleeds.   Eyes: Negative for blurred vision and double vision.  Respiratory: Negative for cough, shortness of breath and wheezing.   Cardiovascular: Negative for chest pain, palpitations and leg swelling.  Gastrointestinal: Negative for abdominal pain, constipation, diarrhea, nausea and vomiting.  Genitourinary: Negative for dysuria.  Musculoskeletal: Negative for myalgias.  Neurological: Positive for dizziness. Negative for sensory change, speech change, focal weakness, seizures and headaches.    DRUG ALLERGIES:  No Known Allergies  VITALS:  Blood pressure 110/60, pulse 85, temperature 98.1 F (36.7 C), temperature source Oral, resp. rate 16, height 5\' 7"  (1.702 Kelley), weight 58.9 kg (129 lb 14.4 oz), SpO2 92 %.  PHYSICAL EXAMINATION:  Physical Exam  GENERAL:  66 y.o.-year-old patient lying in the bed with no acute distress.  EYES: Pupils equal, round, reactive to light and accommodation. No scleral icterus. Extraocular muscles intact. Pale conjunctiva HEENT: Head atraumatic, normocephalic. Oropharynx and nasopharynx clear.  NECK:  Supple, no jugular venous distention. No thyroid enlargement, no tenderness.  LUNGS: improved breath sounds bilaterally, no wheezing, rales,rhonchi or crepitation. No use of accessory muscles of respiration. Decreased bibasilar breath sounds CARDIOVASCULAR: S1, S2 normal. No murmurs, rubs, or  gallops.  ABDOMEN: Soft, nontender, nondistended. Bowel sounds present. No organomegaly or mass.  EXTREMITIES: No pedal edema, cyanosis, or clubbing.  NEUROLOGIC: Cranial nerves II through XII are intact. Muscle strength 5/5 in all extremities. Sensation intact. Gait not checked. Global weakness noted. PSYCHIATRIC: The patient is alert and oriented x 2-3. Mild cognitive decline noted. SKIN: No obvious rash, lesion, or ulcer.    LABORATORY PANEL:   CBC  Recent Labs Lab 04/30/17 2342  WBC 6.3  HGB 13.3  HCT 38.3  PLT 264   ------------------------------------------------------------------------------------------------------------------  Chemistries   Recent Labs Lab 05/03/17 0335  NA 128*  K 5.2*  CL 87*  CO2 36*  GLUCOSE 122*  BUN 28*  CREATININE 0.82  CALCIUM 8.7*   ------------------------------------------------------------------------------------------------------------------  Cardiac Enzymes  Recent Labs Lab 04/30/17 1744  TROPONINI 0.04*   ------------------------------------------------------------------------------------------------------------------  RADIOLOGY:  No results found.  EKG:   Orders placed or performed during the hospital encounter of 03/10/17  . ED EKG  . ED EKG  . EKG    ASSESSMENT AND PLAN:   Paige Kelley  is a 66 y.o. female with a known history of COPD with ongoing tobacco abuse, history of congestive heart failure, alcohol abuse comes to the emergency room with increasing shortness of breath was found to be hypoxic and noted to have sodium of 112  #1 Acute hyponatremia- beer potomania and dehydration -Received Lasix on admission. And another dose early this AM - received fluids and sodium at 128- fluids stopped this morning -Nephrology consult appreciated  #2 Acute hypoxic respiratory failure- secondary to COPD exacerbation - required Bipap initially, off now and still needing 3L o2, CXR with COPD changes only- incentive  spirometry and CT chest to r/o PE - continue  IV steroids - nebs  #3 chronic systolic CHF-known history of CHF according to brother [place]. Echocardiogram with EF of 20-25% with possible wall motion abnormalities. -Cardiology consulted. Lisinopril on hold due to hyperkalemia. Received 1 dose of Lasix this morning.  #4 Alcohol abuse- counseled, ativan prn- CIWA protocol  #5 DVT prophylaxis- lovenox  Appreciate PT input, will need home health at discharge Updated brother at bedside   All the records are reviewed and case discussed with Care Management/Social Workerr. Management plans discussed with the patient, family and they are in agreement.  CODE STATUS: Full Code  TOTAL TIME TAKING CARE OF THIS PATIENT: 37 minutes.   POSSIBLE D/C IN 1-2 DAYS, DEPENDING ON CLINICAL CONDITION.   Paige Kelley,Paige Kelley Kelley.D on 05/03/2017 at 9:54 AM  Between 7am to 6pm - Pager - [phone]  After 6pm go to [email or web address] - Social research officer, governmentpassword [email or web address] [email or web address]  [facility]  Office  [phone]  CC: Primary care physician; Paige Kelley, Paige M, MD

## 2017-05-03 NOTE — Progress Notes (Signed)
[facility]  ROUNDING NOTE   Subjective:  Patient given Lasix for hyperkalemia earlier today. Serum sodium slowly rising his most recent serum sodium was 128. Overall patient reports feeling better since admission.   Objective:  Vital signs in last 24 hours:  Temp:  [98 F (36.7 C)-98.1 F (36.7 C)] 98.1 F (36.7 C) (07/18 0929) Pulse Rate:  [80-85] 85 (07/18 0929) Resp:  [16] 16 (07/18 0929) BP: (94-110)/(57-65) 110/60 (07/18 0929) SpO2:  [92 %-97 %] 92 % (07/18 0929)  Weight change:  Filed Weights   04/30/17 1740 04/30/17 2344 05/01/17 0503  Weight: 68 kg (150 lb) 60.2 kg (132 lb 12.8 oz) 58.9 kg (129 lb 14.4 oz)    Intake/Output: I/O last 3 completed shifts: In: 1290 [P.O.:840; I.V.:450] Out: -    Intake/Output this shift:  No intake/output data recorded.  Physical Exam: General: No acute distress  Head: Normocephalic, atraumatic. Moist oral mucosal membranes  Eyes: Anicteric  Neck: Supple, trachea midline  Lungs:  Scattered rhonchi, normal effort  Heart: S1S2 no rubs  Abdomen:  Soft, nontender, bowel sounds present  Extremities: No peripheral edema.  Neurologic: Awake, alert, following commands  Skin: No lesions       Basic Metabolic Panel:  Recent Labs Lab 04/30/17 2342 05/01/17 0528 05/01/17 1121 05/02/17 0536 05/03/17 0335  NA 117* 120* 121* 126* 128*  K 4.0 3.8 3.3* 4.4 5.2*  CL 77* 77* 75* 84* 87*  CO2 29 33* 33* 35* 36*  GLUCOSE 127* 142* 200* 140* 122*  BUN 15 16 20  25* 28*  CREATININE 0.68 0.67 0.70 0.70 0.82  CALCIUM 9.0 9.1 8.9 8.8* 8.7*    Liver Function Tests:  Recent Labs Lab 05/03/17 0335  AST 36  ALT 41  ALKPHOS 43  BILITOT 0.4  PROT 5.7*  ALBUMIN 3.2*   No results for input(s): LIPASE, AMYLASE in the last 168 hours. No results for input(s): AMMONIA in the last 168 hours.  CBC:  Recent Labs Lab 04/30/17 1744 04/30/17 2342  WBC 6.6 6.3  NEUTROABS 5.5  --   HGB 12.4 13.3  HCT 34.4* 38.3  MCV  91.3 91.3  PLT 254 264    Cardiac Enzymes:  Recent Labs Lab 04/30/17 1744  TROPONINI 0.04*    BNP: Invalid input(s): POCBNP  CBG: No results for input(s): GLUCAP in the last 168 hours.  Microbiology: Results for orders placed or performed during the hospital encounter of 04/30/17  Culture, blood (routine x 2)     Status: None (Preliminary result)   Collection Time: 04/30/17  5:43 PM  Result Value Ref Range Status   Specimen Description BLOOD BLOOD RIGHT FOREARM  Final   Special Requests   Final    BOTTLES DRAWN AEROBIC AND ANAEROBIC Blood Culture adequate volume   Culture NO GROWTH 3 DAYS  Final   Report Status PENDING  Incomplete  Culture, blood (routine x 2)     Status: None (Preliminary result)   Collection Time: 04/30/17  5:50 PM  Result Value Ref Range Status   Specimen Description BLOOD BLOOD LEFT FOREARM  Final   Special Requests   Final    BOTTLES DRAWN AEROBIC AND ANAEROBIC Blood Culture adequate volume   Culture NO GROWTH 3 DAYS  Final   Report Status PENDING  Incomplete    Coagulation Studies: No results for input(s): LABPROT, INR in the last 72 hours.  Urinalysis: No results for input(s): COLORURINE, LABSPEC, PHURINE, GLUCOSEU, HGBUR, BILIRUBINUR, KETONESUR, PROTEINUR, UROBILINOGEN, NITRITE, LEUKOCYTESUR in  the last 72 hours.  Invalid input(s): APPERANCEUR    Imaging: No results found.   Medications:    . atenolol  25 mg Oral Daily  . enoxaparin (LOVENOX) injection  40 mg Subcutaneous Q24H  . folic acid  1 mg Oral Daily  . methylPREDNISolone (SOLU-MEDROL) injection  60 mg Intravenous Q24H  . multivitamin with minerals  1 tablet Oral Daily  . thiamine  100 mg Oral Daily   Or  . thiamine  100 mg Intravenous Daily   acetaminophen **OR** acetaminophen, albuterol, LORazepam **OR** LORazepam, ondansetron **OR** ondansetron (ZOFRAN) IV, senna-docusate  Assessment/ Plan:  66 y.o. female with   COPD, Alcohol, tobacco abuse, CHF , was admitted on  04/30/2017 with respiratory distress, hyponatremia.   1. Hyponatremia, acute on chronic 2. Volume overload with acute pulmonary edema and LE edema upon admission, currently appears euvolemic.  3. Hyperkalemia.   Plan:  Recommend avoiding diuretics at this point in time. Hyperkalemia can be treated with either Kayexalate or veltassa. Agree with holding IV fluids for now as well as the patient's appetite does appear to be improving. Continue to monitor serum sodium as well as potassium over the next several days. We will continue to monitor her progress.   LOS: 3 Parnika Tweten 7/18/201811:12 AM

## 2017-05-03 NOTE — Discharge Instructions (Signed)
Heart Failure Clinic appointment on May 16, 2017 at 8:20am with Clarisa Kindredina Kristyne Woodring, FNP. Please call [phone] to reschedule.

## 2017-05-03 NOTE — Progress Notes (Signed)
Notified MD Sheryle Hailiamond of potassium 5.2; order received to give 40mg  IV lasix once. Nursing staff will continue to monitor for any changes in patient status. Lamonte RicherKara A Deseray Daponte, RN

## 2017-05-03 NOTE — Progress Notes (Signed)
   05/03/17 1155  Clinical Encounter Type  Visited With Patient;Health care provider  Visit Type Initial;Spiritual support  Referral From Nurse   [facility]. Staff informed CH of the patient's current needs and that patient wanted to have a DNR. Patient had just returned from having a test. CH was speaking with patient when her lunch arrived. CH will follow-up after lunch.

## 2017-05-03 NOTE — Plan of Care (Addendum)
Problem: Activity: Goal: Risk for activity intolerance will decrease Outcome: Not Progressing Patient encouraged to get OOB for meals, patient did sit in recliner for meals however patient insisted on laying back down in bed for majority of the day. Patient also encouraged to call for assistance when needing to use the bathroom however patient insisting upon using the external female catheter while lying down. Educated patient on the importance of getting OOB to bedside commode when awake and when she knows she has to use the restroom. Patient verbalized understanding however patient continues to urinate with the external catheter and insists on having it in place. Will continue to encourage patient and offer to help [facility] to [facility] during hourly rounding.    -Attempted to wean patient down on oxygen this morning at 02 sats dropped down in the 80's, patient placed back up to 3L. Will continue to assess and monitor.

## 2017-05-04 ENCOUNTER — Telehealth: Payer: Self-pay | Admitting: Cardiovascular Disease

## 2017-05-04 LAB — BASIC METABOLIC PANEL
Anion gap: 8 (ref 5–15)
BUN: 30 mg/dL — ABNORMAL HIGH (ref 6–20)
CHLORIDE: 83 mmol/L — AB (ref 101–111)
CO2: 39 mmol/L — ABNORMAL HIGH (ref 22–32)
CREATININE: 0.81 mg/dL (ref 0.44–1.00)
Calcium: 8.7 mg/dL — ABNORMAL LOW (ref 8.9–10.3)
GFR calc non Af Amer: >60 mL/min (ref >60)
Glucose, Bld: 141 mg/dL — ABNORMAL HIGH (ref 65–99)
Potassium: 5.1 mmol/L (ref 3.5–5.1)
SODIUM: 130 mmol/L — AB (ref 135–145)

## 2017-05-04 LAB — CBC
HEMATOCRIT: 37.2 % (ref 35.0–47.0)
HEMOGLOBIN: 12.6 g/dL (ref 12.0–16.0)
MCH: 32.9 pg (ref 26.0–34.0)
MCHC: 33.8 g/dL (ref 32.0–36.0)
MCV: 97.2 fL (ref 80.0–100.0)
Platelets: 240 10*3/uL (ref 150–440)
RBC: 3.82 MIL/uL (ref 3.80–5.20)
RDW: 13.5 % (ref 11.5–14.5)
WBC: 7.1 10*3/uL (ref 3.6–11.0)

## 2017-05-04 MED ORDER — FUROSEMIDE 20 MG PO TABS: 20.0000 mg | ORAL_TABLET | Freq: Every day | ORAL | 2 refills | Status: DC

## 2017-05-04 MED ORDER — FUROSEMIDE 20 MG PO TABS
20.0000 mg | ORAL_TABLET | Freq: Every day | ORAL | Status: DC
  Administered 2017-05-04: 20 mg via ORAL
  Filled 2017-05-04: qty 1

## 2017-05-04 MED ORDER — ALBUTEROL SULFATE (2.5 MG/3ML) 0.083% IN NEBU: 2.5000 mg | INHALATION_SOLUTION | RESPIRATORY_TRACT | 12 refills | Status: DC | PRN

## 2017-05-04 MED ORDER — PREDNISONE 10 MG (21) PO TBPK: ORAL_TABLET | ORAL | 0 refills | Status: DC

## 2017-05-04 MED ORDER — CARVEDILOL 3.125 MG PO TABS: 3.1250 mg | ORAL_TABLET | Freq: Two times a day (BID) | ORAL | 2 refills | Status: DC

## 2017-05-04 NOTE — Progress Notes (Signed)
New referral for Home Palliative received from [facility] Ermalene SearingNann Greene. Plan is for discharge home with home health today. Patient information faxed to referral. Thank you. Dayna BarkerKaren Robertson RN, BSN, [facility] [place], hospital liaison [phone] c

## 2017-05-04 NOTE — Progress Notes (Signed)
   05/04/17 0920  Clinical Encounter Type  Visited With Patient;Family;Health care provider  Visit Type Follow-up;Spiritual support  Referral From Nurse;Physician  Advance Directives (For Healthcare)  Does Patient Have a Medical Advance Directive? No  Would patient like information on creating a medical advance directive? Yes (Inpatient - patient requests chaplain consult to create a medical advance directive)   [facility] and [facility]. CH educated patient and her brother about an advance directive. CH consulted with nurse about DNR and then informed family that the order would have to come from patient's physician. CH will follow-up when patient's daughter arrives. Patient acted differently today while CH was jin the room; there is tension between the family members.

## 2017-05-04 NOTE — Telephone Encounter (Signed)
Attempted to contact pt regarding discharge [facility] on [facility]. Left message asking pt to call back regarding discharge instructions and/or medications. Advised pt of appt w/ Dr. Mariah MillingGollan on 04/29/17 at 8/20 w/ [facility]. Asked pt to call back if unable to keep this appt.

## 2017-05-04 NOTE — Care Management Important Message (Signed)
Important Message  Patient Details  Name: Paige Kelley MRN: [id number] Date of Birth: 10/14/1951   Medicare Important Message Given:  Yes Signed IM notice given   Eber HongGreene, Donivan Thammavong R, RN 05/04/2017, 10:29 AM

## 2017-05-04 NOTE — Discharge Summary (Signed)
[employer] - [place] at [facility]   PATIENT NAME: Paige CornfieldWanda Kelley    MR#:  [id number]  DATE OF BIRTH:  19-Jul-1951  DATE OF ADMISSION:  04/30/2017   ADMITTING PHYSICIAN: Enedina FinnerSona Patel, MD  DATE OF DISCHARGE: 05/04/2017  3:57 PM  PRIMARY CARE PHYSICIAN: Pricilla HolmSharpe, Leslie M, MD   ADMISSION DIAGNOSIS:   Hyponatremia [E87.1] Acute respiratory failure with hypoxia (HCC) [J96.01] Abdominal pain [R10.9] Acute congestive heart failure, unspecified heart failure type (HCC) [I50.9]  DISCHARGE DIAGNOSIS:   Principal Problem:   Acute respiratory failure with hypoxia (HCC) Active Problems:   COPD with acute exacerbation (HCC)   Alcohol use   HTN (hypertension)   Hyponatremia   Acute systolic CHF (congestive heart failure) (HCC)   SECONDARY DIAGNOSIS:   Past Medical History:  Diagnosis Date  . Alcohol use   . Chronic systolic CHF (congestive heart failure) (HCC)    a. TTE 04/2017: EF 20-25%, unable to exclude RWMA, GR2DD, mild AI, severe MR, mild biatrial enlargement, mod-sev TR, moderately elevated PASP  . Hypertension     HOSPITAL COURSE:   Paige BrayWanda Kelley a 66 y.o. femalewith a known history of COPD with ongoing tobacco abuse, history of congestive heart failure, alcohol abuse comes to the emergency room with increasing shortness of breath was found to be hypoxic and noted to have sodium of 112  #1 Acute hyponatremia- beer potomania and dehydration -Received Lasix on admission. And started on low dose at discharge - received fluids and sodium is 130 at discharge -Nephrology consult appreciated  #2 Acute hypoxic respiratory failure- secondary to COPD exacerbation - required Bipap initially, off now and still needing 3L o2,  - CXR with COPD changes, also has ischemic cardiomyopathy and severe CHF. -Encouraged to do incentive spirometry - CT chest is negative for PE - Received IV steroids in the hospital, being discharged on prednisone taper and albuterol  inhaler  #3 chronic systolic CHF- was not in any acute exacerbation here in the hospital.  - Low sodium diet [place], Lasix orally at discharge. Daily weights recommended. -.. Echocardiogram with EF of 20-25% with possible wall motion abnormalities. B ischemic versus nonischemic from alcohol abuse -Cardiology consulted. No further workup recommended due to her noncompliance and alcohol abuse history. Outpatient stress test if she continues to follow up. Repeat echo in 3-6 months -Lisinopril on hold due to hyperkalemia. Started on Coreg..  #4 Alcohol abuse- counseled   Appreciate PT input, discharged with home health Updated brother at bedside  CODE STATUS discussed with both brother and patient at bedside, she wishes to be a DO NOT RESUSCITATE  DISCHARGE CONDITIONS:   Guarded  CONSULTS OBTAINED:   Treatment Team:  Antonieta IbaGollan, Timothy J, MD  DRUG ALLERGIES:   No Known Allergies DISCHARGE MEDICATIONS:   Allergies as of 05/04/2017   No Known Allergies     Medication List    STOP taking these medications   amLODipine 10 MG tablet Commonly known as:  NORVASC   atenolol 25 MG tablet Commonly known as:  TENORMIN   lisinopril 20 MG tablet Commonly known as:  PRINIVIL,ZESTRIL     TAKE these medications   albuterol (2.5 MG/3ML) 0.083% nebulizer solution Commonly known as:  PROVENTIL Take 3 mLs (2.5 mg total) by nebulization every 4 (four) hours as needed for wheezing or shortness of breath.   carvedilol 3.125 MG tablet Commonly known as:  COREG Take 1 tablet (3.125 mg total) by mouth 2 (two) times daily with a meal.  furosemide 20 MG tablet Commonly known as:  LASIX Take 1 tablet (20 mg total) by mouth daily.   predniSONE 10 MG (21) Tbpk tablet Commonly known as:  STERAPRED UNI-PAK 21 TAB 6 tabs PO x 1 day 5 tabs PO x 1 day 4 tabs PO x 1 day 3 tabs PO x 1 day 2 tabs PO x 1 day 1 tab PO x 1 day and stop            Durable Medical Equipment        Start      Ordered   05/04/17 1211  For home use only DME standard manual wheelchair with seat cushion  Once    Comments:  Patient suffers from COPD, CHF which impairs their ability to perform daily activities like walking, daily activities in the home.  A rolling  Dan Humphreys will not resolve issue with performing activities of daily living. A wheelchair will allow patient to safely perform daily activities. Patient can safely propel the wheelchair in the home or has a caregiver who can provide assistance.  Accessories: elevating leg rests (ELRs), wheel locks, extensions and anti-tippers.   05/04/17 1212   05/04/17 0950  For home use only DME Nebulizer machine  Once    Question:  Patient needs a nebulizer to treat with the following condition  Answer:  COPD (chronic obstructive pulmonary disease) (HCC)   05/04/17 1610   05/04/17 0811  For home use only DME oxygen  Once    Comments:  Assess for in home portable concentrator  Question Answer Comment  Mode or (Route) Nasal cannula   Liters per Minute 3   Frequency Continuous (stationary and portable oxygen unit needed)   Oxygen conserving device Yes   Oxygen delivery system Gas      05/04/17 0810   05/04/17 0000  DME Bedside commode    Comments:  Needs to urinate frequently, but weak and has hypoxia and dyspnea on exertion  Question:  Patient needs a bedside commode to treat with the following condition  Answer:  CHF (congestive heart failure) (HCC)   05/04/17 0949       DISCHARGE INSTRUCTIONS:   1. PCP follow-up in 1-2 weeks 2. Cardiology follow-up in 2-3 weeks 3. Palliative care follow-up with home health  DIET:   Cardiac diet  ACTIVITY:   Activity as tolerated  OXYGEN:   Home Oxygen: Yes.    Oxygen Delivery: 3 liters/min via Patient connected to nasal cannula oxygen  DISCHARGE LOCATION:   home   If you experience worsening of your admission symptoms, develop shortness of breath, life threatening emergency, suicidal or homicidal  thoughts you must seek medical attention immediately by calling 911 or calling your MD immediately  if symptoms less severe.  You Must read complete instructions/literature along with all the possible adverse reactions/side effects for all the Medicines you take and that have been prescribed to you. Take any new Medicines after you have completely understood and accpet all the possible adverse reactions/side effects.   Please note  You were cared for by a hospitalist during your hospital stay. If you have any questions about your discharge medications or the care you received while you were in the hospital after you are discharged, you can call the unit and asked to speak with the hospitalist on call if the hospitalist that took care of you is not available. Once you are discharged, your primary care physician will handle any further medical issues. Please note that NO  REFILLS for any discharge medications will be authorized once you are discharged, as it is imperative that you return to your primary care physician (or establish a relationship with a primary care physician if you do not have one) for your aftercare needs so that they can reassess your need for medications and monitor your lab values.    On the day of Discharge:  VITAL SIGNS:   Blood pressure 134/79, pulse 83, temperature 98.2 F (36.8 C), temperature source Oral, resp. rate 17, height 5\' 7"  (1.702 m), weight 58.9 kg (129 lb 14.4 oz), SpO2 95 %.  PHYSICAL EXAMINATION:    GENERAL:  66 y.o.-year-old patient lying in the bed with no acute distress.  EYES: Pupils equal, round, reactive to light and accommodation. No scleral icterus. Extraocular muscles intact. Pale conjunctiva HEENT: Head atraumatic, normocephalic. Oropharynx and nasopharynx clear.  NECK:  Supple, no jugular venous distention. No thyroid enlargement, no tenderness.  LUNGS: improved breath sounds bilaterally, no wheezing, rales,rhonchi or crepitation. No use of  accessory muscles of respiration. Decreased bibasilar breath sounds CARDIOVASCULAR: S1, S2 normal. No murmurs, rubs, or gallops.  ABDOMEN: Soft, nontender, nondistended. Bowel sounds present. No organomegaly or mass.  EXTREMITIES: No pedal edema, cyanosis, or clubbing.  NEUROLOGIC: Cranial nerves II through XII are intact. Muscle strength 5/5 in all extremities. Sensation intact. Gait not checked. Global weakness noted. PSYCHIATRIC: The patient is alert and oriented x  . Mild cognitive decline noted. SKIN: No obvious rash, lesion, or ulcer.    DATA REVIEW:   CBC  Recent Labs Lab 05/04/17 0336  WBC 7.1  HGB 12.6  HCT 37.2  PLT 240    Chemistries   Recent Labs Lab 05/03/17 0335  05/03/17 2005 05/04/17 0336  NA 128*  --   --  130*  K 5.2*  --  4.4 5.1  CL 87*  --   --  83*  CO2 36*  --   --  39*  GLUCOSE 122*  --   --  141*  BUN 28*  --   --  30*  CREATININE 0.82  --   --  0.81  CALCIUM 8.7*  --   --  8.7*  MG  --   < > 1.9  --   AST 36  --   --   --   ALT 41  --   --   --   ALKPHOS 43  --   --   --   BILITOT 0.4  --   --   --   < > = values in this interval not displayed.   Microbiology Results  Results for orders placed or performed during the hospital encounter of 04/30/17  Culture, blood (routine x 2)     Status: None (Preliminary result)   Collection Time: 04/30/17  5:43 PM  Result Value Ref Range Status   Specimen Description BLOOD BLOOD RIGHT FOREARM  Final   Special Requests   Final    BOTTLES DRAWN AEROBIC AND ANAEROBIC Blood Culture adequate volume   Culture NO GROWTH 4 DAYS  Final   Report Status PENDING  Incomplete  Culture, blood (routine x 2)     Status: None (Preliminary result)   Collection Time: 04/30/17  5:50 PM  Result Value Ref Range Status   Specimen Description BLOOD BLOOD LEFT FOREARM  Final   Special Requests   Final    BOTTLES DRAWN AEROBIC AND ANAEROBIC Blood Culture adequate volume   Culture NO GROWTH 4 DAYS  Final   Report Status  PENDING  Incomplete    RADIOLOGY:  No results found.   Management plans discussed with the patient, family and they are in agreement.  CODE STATUS:     Code Status Orders        Start     Ordered   05/04/17 [phone]  Do not attempt resuscitation (DNR)  Continuous    Question Answer Comment  In the event of cardiac or respiratory ARREST Do not call a "code blue"   In the event of cardiac or respiratory ARREST Do not perform Intubation, CPR, defibrillation or ACLS   In the event of cardiac or respiratory ARREST Use medication by any route, position, wound care, and other measures to relive pain and suffering. May use oxygen, suction and manual treatment of airway obstruction as needed for comfort.      05/04/17 0951    Code Status History    Date Active Date Inactive Code Status Order ID Comments User Context   04/30/2017 11:38 PM 05/04/2017  9:51 AM Full Code [id number]  Enedina Finner, MD Inpatient   03/10/2017 11:02 PM 03/12/2017  3:02 PM Full Code [id number]  Oralia Manis, MD Inpatient      TOTAL TIME TAKING CARE OF THIS PATIENT: 37 minutes.    Enid Baas M.D on 05/04/2017 at 4:35 PM  Between 7am to 6pm - Pager - [phone]  After 6pm go to [email or web address] - Scientist, research (life sciences) [facility]  Office  [phone]  CC: Primary care physician; Pricilla Holm, MD   Note: This dictation was prepared with Dragon dictation along with smaller phrase technology. Any transcriptional errors that result from this process are unintentional.

## 2017-05-04 NOTE — Progress Notes (Signed)
   05/04/17 1245  Clinical Encounter Type  Visited With Patient;Family;Patient and family together  Visit Type Initial  Referral From Nurse  Spiritual Encounters  Spiritual Needs Literature (Advance Directive)   CH provided education on Advance Directive. Patient would like to finish it tomorrow, so that family can discuss it.

## 2017-05-04 NOTE — Progress Notes (Signed)
SATURATION QUALIFICATIONS: (This note is used to comply with regulatory documentation for home oxygen)  Patient Saturations on Room Air at Rest = 88%  Patient Saturations on Room Air while Ambulating = n/a%  Patient Saturations on  Liters of oxygen while Ambulating = n/a%  Please briefly explain why patient needs home oxygen: patient desats on room air at rest.

## 2017-05-04 NOTE — Care Management (Signed)
Patient to discharge home today.  Will be followed by palliative care and home health.  No agency preference.  Referral called to and accepted by [facility].  Services- SN PT OT Aide and SW. DME- home 02, home nebulizer, walker, BSC, and wheelchair. Patient's hypoxia was not resolved with use of iv steroids, diuretics and nebulizer.  Niece to transport home.  Spoke with patient's brother regarding discharge disposition. Family has firmly spoken to patient regarding not drinking alcohol or  smoking once discharged. [facility] will see patient 7/20.

## 2017-05-04 NOTE — Progress Notes (Signed)
Discharge instructions given to patient and son. Verbalized understanding. [facility] delivered from [facility]. No distress at this time. Patient waiting for transportation. Will remove IVs and tele once transportation arrives.

## 2017-05-04 NOTE — Progress Notes (Signed)
Physical Therapy Treatment Patient Details Name: Paige Kelley MRN: [id number] DOB: 1951/06/06 Today's Date: 05/04/2017    History of Present Illness Pt is a 66 y.o.femalewith medical problems of COPD, Alcohol, tobacco abuse, CHF who [facility] evaluation of worsening SOB. Nephrology consult for evluation of severe Hyponatremia.  Assessment includes: acute hypoxic resp failure secondary to COPD exacerbation adn acute CHF, cachexia with severe malnutrition, hyponatremia, and h/o alcohol abuse.    PT Comments    Pt initially declined session stating she was fatigued.  Pt agreed after education on importance of mobility skills.  Pt stated she needed to void first but refused to use bedside commode as she had external catheter in place.  Discussed importance of mobility for ADL tasks but she had already started to void.  To edge of bed without assist.  She was able to ambulate 30' x 2 with walker and min guard with mod verbal cues for walker position.  Brother in attendance and encouraged assist with gait at al times and need for verbal cues.  She required seated rest before returning to bed.  She again had to void and was assisted to bedside commode with min assist.  She was able to return to supine without assist.  Pt will benefit from walker, 3-in-1 commode and HHPT upon discharge.  She will also benefit from a wheelchair. Pt suffers from COPD and respiratory issues which impact her ability to perform daily activities like general ambulation skills in her home and in the community. While a walker does assist pt with ambulation, she is unable to ambulate consistantly and for multiple trials while performing tasks.  A wheelchair will allow pt to safely perform daily activities.  Pt can safely propel the wheelchair in the home and has a caregiver who can assist as needed.   Follow Up Recommendations  Home health PT     Equipment Recommendations  Rolling walker with  5" wheels;3in1 (PT);Wheelchair (measurements PT)    Recommendations for Other Services       Precautions / Restrictions Precautions Precautions: Fall Restrictions Weight Bearing Restrictions: No    Mobility  Bed Mobility Overal bed mobility: Independent                Transfers Overall transfer level: Needs assistance Equipment used: Rolling walker (2 wheeled) Transfers: Sit to/from Stand Sit to Stand: Supervision;Min guard         General transfer comment: overall improved safety with transfers today  Ambulation/Gait Ambulation/Gait assistance: Min guard Ambulation Distance (Feet): 30 Feet Assistive device: Rolling walker (2 wheeled) Gait Pattern/deviations: Step-through pattern   Gait velocity interpretation: Below normal speed for age/gender     Stairs            Wheelchair Mobility    Modified Rankin (Stroke Patients Only)       Balance Overall balance assessment: Needs assistance Sitting-balance support: No upper extremity supported;Feet supported Sitting balance-Leahy Scale: Good     Standing balance support: Bilateral upper extremity supported Standing balance-Leahy Scale: Fair                              Cognition Arousal/Alertness: Awake/alert Behavior During Therapy: WFL for tasks assessed/performed Overall Cognitive Status: Within Functional Limits for tasks assessed  Exercises Other Exercises Other Exercises: to bedside commode to void    General Comments        Pertinent Vitals/Pain Pain Assessment: No/denies pain    Home Living                      Prior Function            PT Goals (current goals can now be found in the care plan section) Progress towards PT goals: Progressing toward goals    Frequency    Min 2X/week      PT Plan Current plan remains appropriate    Co-evaluation              AM-PAC PT "6 Clicks"  Daily Activity  Outcome Measure  Difficulty turning over in bed (including adjusting bedclothes, sheets and blankets)?: None Difficulty moving from lying on back to sitting on the side of the bed? : None Difficulty sitting down on and standing up from a chair with arms (e.g., wheelchair, bedside commode, etc,.)?: A Little Help needed moving to and from a bed to chair (including a wheelchair)?: A Little Help needed walking in hospital room?: A Little Help needed climbing 3-5 steps with a railing? : A Lot 6 Click Score: 19    End of Session Equipment Utilized During Treatment: Gait belt;Oxygen Activity Tolerance: Patient limited by fatigue Patient left: in bed;with bed alarm set;with call bell/phone within reach;with family/visitor present         Time: 0941-1010 PT Time Calculation (min) (ACUTE ONLY): 29 min  Charges:  $Gait Training: 8-22 mins $Therapeutic Activity: 8-22 mins                    G Codes:       Danielle Dess, PTA 05/04/17, 10:29 AM]

## 2017-05-04 NOTE — Telephone Encounter (Signed)
TCM.... Patient being discharged today  Saw Eula ListenRyan dunn in hospital  Is due back in 3 weeks She is coming on 04/29/17 to see Dr Mariah MillingGollan

## 2017-05-04 NOTE — Progress Notes (Signed)
   05/04/17 1221  Clinical Encounter Type  Visited With Patient;Health care provider  Visit Type Follow-up  Referral From Nurse  Spiritual Encounters  Spiritual Needs Emotional;Other (Comment) (Advance Directive)   CH follow-up with patient about completing her advance directive. Patient did not seem to understand completely the process. Patient stated that a DNR is what she wanted; and CH was able to assist patient earlier in how to obtain a DNR. CH spoke with nurse and notary about patients lack of responses. Nurse will contact CH once patient's family arrives. Nurse did state that patient has given up.

## 2017-05-04 NOTE — Progress Notes (Signed)
   [employer] - [place] at [facility]   Advance care planning  Hospital Day: 4 days Kathreen CornfieldWanda Kelley is a 66 y.o. female presenting with Respiratory Distress and Weakness .   Advance care planning discussed with patient  And her brother  at bedside. Patient is alert and oriented and is capable of making her own medical decisions. Advanced directives will be brought in later today. All questions in regards to overall condition and expected prognosis answered. The decision was made to change her CODE STATUS to DO NOT RESUSCITATE  CODE STATUS: DO NOT RESUSCITATE  Time spent: 18 minutes

## 2017-05-05 LAB — CULTURE, BLOOD (ROUTINE X 2)
CULTURE: NO GROWTH
Culture: NO GROWTH
Special Requests: ADEQUATE
Special Requests: ADEQUATE

## 2017-05-08 LAB — BLOOD GAS, VENOUS
Acid-Base Excess: 3.7 mmol/L — ABNORMAL HIGH (ref 0.0–2.0)
Bicarbonate: 30.1 mmol/L — ABNORMAL HIGH (ref 20.0–28.0)
O2 Saturation: 93 %
PCO2 VEN: 52 mmHg (ref 44.0–60.0)
PO2 VEN: 69 mmHg — AB (ref 32.0–45.0)
Patient temperature: 37
pH, Ven: 7.37 (ref 7.250–7.430)

## 2017-05-16 ENCOUNTER — Telehealth: Payer: Self-pay | Admitting: Family

## 2017-05-16 ENCOUNTER — Ambulatory Visit: Payer: PRIVATE HEALTH INSURANCE | Admitting: Family

## 2017-05-16 NOTE — Telephone Encounter (Signed)
Patient missed her initial appointment at [facility] on 05/16/17. Will attempt to reschedule.

## 2017-05-24 NOTE — Care Management (Signed)
Received a call from patient's sister in law Augusto GarbeLorie Carter.  Her husband Caryn BeeKevin who is the patient's brother "does not know what is going on."  Patient was discharged to daughter [place]'s home.  Equipment was delivered to that residence but it sounds as though palliative (Ms Sherlon HandingRodriguez ) care has not been able to make contact with patient. She  has left many message for daughter Alcario Droughtrica and calls have not been returned to make an appointment.  [facility] made a visit 8/7 which should have been made 8/2, but calls were not returned to make appointment. [facility] is having to "just show up" for visits.  Have shown up at house and no answer to knock at door but "having a feeling patient is in the home but just not answering the door."  Brother kevin had concerns that maybe patient was in the home deceased.  he was very involved with patient's care while in the hospital but his sister has not allowed him to be involved now.  Patient and her daughter have addiction issues.  it is not known if patient is drinking.  Family may consider APS report.  Discussed that kevin will call [facility] to discuss concerns and suggested contacting Palliative and making an appointment and showing up at the home with palliative nurse.

## 2017-05-25 ENCOUNTER — Emergency Department: Payer: Medicare Other

## 2017-05-25 ENCOUNTER — Encounter: Payer: Self-pay | Admitting: Emergency Medicine

## 2017-05-25 ENCOUNTER — Inpatient Hospital Stay
Admission: EM | Admit: 2017-05-25 | Discharge: 2017-05-30 | DRG: 189 | Disposition: A | Discharge status: Skilled Nursing Facility | Payer: Medicare Other | Attending: Internal Medicine | Admitting: Internal Medicine

## 2017-05-25 DIAGNOSIS — Z716 Tobacco abuse counseling: Secondary | ICD-10-CM | POA: Diagnosis not present

## 2017-05-25 DIAGNOSIS — F17210 Nicotine dependence, cigarettes, uncomplicated: Secondary | ICD-10-CM | POA: Diagnosis present

## 2017-05-25 DIAGNOSIS — J449 Chronic obstructive pulmonary disease, unspecified: Secondary | ICD-10-CM | POA: Diagnosis present

## 2017-05-25 DIAGNOSIS — R0603 Acute respiratory distress: Secondary | ICD-10-CM

## 2017-05-25 DIAGNOSIS — I081 Rheumatic disorders of both mitral and tricuspid valves: Secondary | ICD-10-CM | POA: Diagnosis present

## 2017-05-25 DIAGNOSIS — F1010 Alcohol abuse, uncomplicated: Secondary | ICD-10-CM | POA: Diagnosis present

## 2017-05-25 DIAGNOSIS — J9621 Acute and chronic respiratory failure with hypoxia: Principal | ICD-10-CM | POA: Diagnosis present

## 2017-05-25 DIAGNOSIS — R011 Cardiac murmur, unspecified: Secondary | ICD-10-CM | POA: Diagnosis present

## 2017-05-25 DIAGNOSIS — I429 Cardiomyopathy, unspecified: Secondary | ICD-10-CM | POA: Diagnosis present

## 2017-05-25 DIAGNOSIS — Z8249 Family history of ischemic heart disease and other diseases of the circulatory system: Secondary | ICD-10-CM | POA: Diagnosis not present

## 2017-05-25 DIAGNOSIS — Z6821 Body mass index (BMI) 21.0-21.9, adult: Secondary | ICD-10-CM

## 2017-05-25 DIAGNOSIS — Z9981 Dependence on supplemental oxygen: Secondary | ICD-10-CM | POA: Diagnosis not present

## 2017-05-25 DIAGNOSIS — I5023 Acute on chronic systolic (congestive) heart failure: Secondary | ICD-10-CM | POA: Diagnosis not present

## 2017-05-25 DIAGNOSIS — J9622 Acute and chronic respiratory failure with hypercapnia: Secondary | ICD-10-CM | POA: Diagnosis not present

## 2017-05-25 DIAGNOSIS — R0600 Dyspnea, unspecified: Secondary | ICD-10-CM

## 2017-05-25 DIAGNOSIS — E43 Unspecified severe protein-calorie malnutrition: Secondary | ICD-10-CM | POA: Diagnosis present

## 2017-05-25 DIAGNOSIS — I110 Hypertensive heart disease with heart failure: Secondary | ICD-10-CM | POA: Diagnosis present

## 2017-05-25 DIAGNOSIS — Z66 Do not resuscitate: Secondary | ICD-10-CM | POA: Diagnosis present

## 2017-05-25 DIAGNOSIS — J439 Emphysema, unspecified: Secondary | ICD-10-CM | POA: Diagnosis present

## 2017-05-25 DIAGNOSIS — I5021 Acute systolic (congestive) heart failure: Secondary | ICD-10-CM | POA: Diagnosis not present

## 2017-05-25 DIAGNOSIS — E871 Hypo-osmolality and hyponatremia: Secondary | ICD-10-CM | POA: Diagnosis present

## 2017-05-25 DIAGNOSIS — R000 Tachycardia, unspecified: Secondary | ICD-10-CM | POA: Diagnosis present

## 2017-05-25 DIAGNOSIS — J9620 Acute and chronic respiratory failure, unspecified whether with hypoxia or hypercapnia: Secondary | ICD-10-CM | POA: Diagnosis present

## 2017-05-25 DIAGNOSIS — J441 Chronic obstructive pulmonary disease with (acute) exacerbation: Secondary | ICD-10-CM | POA: Diagnosis not present

## 2017-05-25 DIAGNOSIS — N390 Urinary tract infection, site not specified: Secondary | ICD-10-CM | POA: Diagnosis present

## 2017-05-25 DIAGNOSIS — Z9119 Patient's noncompliance with other medical treatment and regimen: Secondary | ICD-10-CM | POA: Diagnosis not present

## 2017-05-25 DIAGNOSIS — Z79899 Other long term (current) drug therapy: Secondary | ICD-10-CM

## 2017-05-25 HISTORY — DX: Tobacco use: Z72.0

## 2017-05-25 HISTORY — DX: Patient's noncompliance with other medical treatment and regimen due to unspecified reason: Z91.199

## 2017-05-25 HISTORY — DX: Patient's noncompliance with other medical treatment and regimen: Z91.19

## 2017-05-25 LAB — GLUCOSE, CAPILLARY: Glucose-Capillary: 113 mg/dL — ABNORMAL HIGH (ref 65–99)

## 2017-05-25 LAB — CBC WITH DIFFERENTIAL/PLATELET
BASOS ABS: 0.1 10*3/uL (ref 0–0.1)
BASOS PCT: 1 %
Eosinophils Absolute: 0.1 10*3/uL (ref 0–0.7)
Eosinophils Relative: 1 %
HEMATOCRIT: 38 % (ref 35.0–47.0)
HEMOGLOBIN: 12.7 g/dL (ref 12.0–16.0)
LYMPHS PCT: 17 %
Lymphs Abs: 1.8 10*3/uL (ref 1.0–3.6)
MCH: 31.5 pg (ref 26.0–34.0)
MCHC: 33.5 g/dL (ref 32.0–36.0)
MCV: 94 fL (ref 80.0–100.0)
MONO ABS: 0.7 10*3/uL (ref 0.2–0.9)
Monocytes Relative: 7 %
Neutro Abs: 7.6 10*3/uL — ABNORMAL HIGH (ref 1.4–6.5)
Neutrophils Relative %: 74 %
Platelets: 369 10*3/uL (ref 150–440)
RBC: 4.04 MIL/uL (ref 3.80–5.20)
RDW: 13.6 % (ref 11.5–14.5)
WBC: 10.3 10*3/uL (ref 3.6–11.0)

## 2017-05-25 LAB — BASIC METABOLIC PANEL
ANION GAP: 11 (ref 5–15)
BUN: 24 mg/dL — ABNORMAL HIGH (ref 6–20)
CALCIUM: 9.1 mg/dL (ref 8.9–10.3)
CO2: 28 mmol/L (ref 22–32)
Chloride: 98 mmol/L — ABNORMAL LOW (ref 101–111)
Creatinine, Ser: 0.7 mg/dL (ref 0.44–1.00)
GFR calc Af Amer: >60 mL/min (ref >60)
GFR calc non Af Amer: >60 mL/min (ref >60)
GLUCOSE: 144 mg/dL — AB (ref 65–99)
POTASSIUM: 4.6 mmol/L (ref 3.5–5.1)
Sodium: 137 mmol/L (ref 135–145)

## 2017-05-25 LAB — BLOOD GAS, VENOUS
Acid-Base Excess: 10.3 mmol/L — ABNORMAL HIGH (ref 0.0–2.0)
BICARBONATE: 39.8 mmol/L — AB (ref 20.0–28.0)
PCO2 VEN: 79 mmHg — AB (ref 44.0–60.0)
PH VEN: 7.31 (ref 7.250–7.430)
Patient temperature: 37
pO2, Ven: <31 mmHg — CL (ref 32.0–45.0)

## 2017-05-25 LAB — TROPONIN I: Troponin I: 0.04 ng/mL (ref <0.03)

## 2017-05-25 LAB — MRSA PCR SCREENING: MRSA by PCR: NEGATIVE

## 2017-05-25 LAB — BRAIN NATRIURETIC PEPTIDE: B NATRIURETIC PEPTIDE 5: 1903 pg/mL — AB (ref 0.0–100.0)

## 2017-05-25 MED ORDER — IPRATROPIUM-ALBUTEROL 0.5-2.5 (3) MG/3ML IN SOLN
3.0000 mL | Freq: Four times a day (QID) | RESPIRATORY_TRACT | Status: DC | PRN
  Administered 2017-05-28: 3 mL via RESPIRATORY_TRACT
  Filled 2017-05-25: qty 3

## 2017-05-25 MED ORDER — SENNA 8.6 MG PO TABS
1.0000 | ORAL_TABLET | Freq: Two times a day (BID) | ORAL | Status: DC
  Administered 2017-05-25 – 2017-05-30 (×8): 8.6 mg via ORAL
  Filled 2017-05-25 (×9): qty 1

## 2017-05-25 MED ORDER — ENOXAPARIN SODIUM 40 MG/0.4ML ~~LOC~~ SOLN
40.0000 mg | SUBCUTANEOUS | Status: DC
  Filled 2017-05-25 (×3): qty 0.4

## 2017-05-25 MED ORDER — METHYLPREDNISOLONE SODIUM SUCC 40 MG IJ SOLR
40.0000 mg | Freq: Two times a day (BID) | INTRAMUSCULAR | Status: DC
  Administered 2017-05-25 – 2017-05-30 (×11): 40 mg via INTRAVENOUS
  Filled 2017-05-25 (×11): qty 1

## 2017-05-25 MED ORDER — ALBUTEROL SULFATE (2.5 MG/3ML) 0.083% IN NEBU: 2.5000 mg | INHALATION_SOLUTION | RESPIRATORY_TRACT | Status: DC | PRN

## 2017-05-25 MED ORDER — ACETAMINOPHEN 650 MG RE SUPP: 650.0000 mg | Freq: Four times a day (QID) | RECTAL | Status: DC | PRN

## 2017-05-25 MED ORDER — GUAIFENESIN ER 600 MG PO TB12
600.0000 mg | ORAL_TABLET | Freq: Two times a day (BID) | ORAL | Status: DC
  Administered 2017-05-25 – 2017-05-30 (×11): 600 mg via ORAL
  Filled 2017-05-25 (×11): qty 1

## 2017-05-25 MED ORDER — CARVEDILOL 3.125 MG PO TABS
3.1250 mg | ORAL_TABLET | Freq: Two times a day (BID) | ORAL | Status: DC
  Administered 2017-05-25 – 2017-05-28 (×7): 3.125 mg via ORAL
  Filled 2017-05-25 (×7): qty 1

## 2017-05-25 MED ORDER — TIOTROPIUM BROMIDE MONOHYDRATE 18 MCG IN CAPS: 18.0000 ug | ORAL_CAPSULE | Freq: Every day | RESPIRATORY_TRACT | Status: DC

## 2017-05-25 MED ORDER — ORAL CARE MOUTH RINSE
15.0000 mL | Freq: Two times a day (BID) | OROMUCOSAL | Status: DC
  Administered 2017-05-25 – 2017-05-30 (×8): 15 mL via OROMUCOSAL

## 2017-05-25 MED ORDER — ONDANSETRON HCL 4 MG PO TABS: 4.0000 mg | ORAL_TABLET | Freq: Four times a day (QID) | ORAL | Status: DC | PRN

## 2017-05-25 MED ORDER — NITROGLYCERIN IN D5W 200-5 MCG/ML-% IV SOLN
INTRAVENOUS | Status: AC
  Filled 2017-05-25: qty 250

## 2017-05-25 MED ORDER — CHLORHEXIDINE GLUCONATE 0.12 % MT SOLN
15.0000 mL | Freq: Two times a day (BID) | OROMUCOSAL | Status: DC
  Administered 2017-05-25 – 2017-05-30 (×9): 15 mL via OROMUCOSAL
  Filled 2017-05-25 (×10): qty 15

## 2017-05-25 MED ORDER — BUDESONIDE 0.25 MG/2ML IN SUSP
0.2500 mg | Freq: Two times a day (BID) | RESPIRATORY_TRACT | Status: DC
  Administered 2017-05-25 – 2017-05-30 (×11): 0.25 mg via RESPIRATORY_TRACT
  Filled 2017-05-25 (×11): qty 2

## 2017-05-25 MED ORDER — FUROSEMIDE 10 MG/ML IJ SOLN
20.0000 mg | Freq: Two times a day (BID) | INTRAMUSCULAR | Status: DC
  Administered 2017-05-25 – 2017-05-26 (×3): 20 mg via INTRAVENOUS
  Filled 2017-05-25 (×3): qty 2

## 2017-05-25 MED ORDER — POLYETHYLENE GLYCOL 3350 17 G PO PACK: 17.0000 g | PACK | Freq: Every day | ORAL | Status: DC | PRN

## 2017-05-25 MED ORDER — TRAMADOL HCL 50 MG PO TABS
50.0000 mg | ORAL_TABLET | Freq: Four times a day (QID) | ORAL | Status: DC | PRN
  Administered 2017-05-27 – 2017-05-30 (×4): 50 mg via ORAL
  Filled 2017-05-25 (×4): qty 1

## 2017-05-25 MED ORDER — METHYLPREDNISOLONE SODIUM SUCC 40 MG IJ SOLR: 40.0000 mg | Freq: Three times a day (TID) | INTRAMUSCULAR | Status: DC

## 2017-05-25 MED ORDER — ACETAMINOPHEN 325 MG PO TABS
650.0000 mg | ORAL_TABLET | Freq: Four times a day (QID) | ORAL | Status: DC | PRN
  Administered 2017-05-25 – 2017-05-27 (×3): 650 mg via ORAL
  Filled 2017-05-25 (×3): qty 2

## 2017-05-25 MED ORDER — ONDANSETRON HCL 4 MG/2ML IJ SOLN
4.0000 mg | Freq: Four times a day (QID) | INTRAMUSCULAR | Status: DC | PRN
  Administered 2017-05-28 – 2017-05-29 (×2): 4 mg via INTRAVENOUS
  Filled 2017-05-25 (×2): qty 2

## 2017-05-25 MED ORDER — NITROGLYCERIN IN D5W 200-5 MCG/ML-% IV SOLN
0.0000 ug/min | Freq: Once | INTRAVENOUS | Status: AC
  Administered 2017-05-25: 5 ug/min via INTRAVENOUS

## 2017-05-25 MED ORDER — IPRATROPIUM-ALBUTEROL 0.5-2.5 (3) MG/3ML IN SOLN
3.0000 mL | RESPIRATORY_TRACT | Status: DC
  Administered 2017-05-25 – 2017-05-27 (×12): 3 mL via RESPIRATORY_TRACT
  Filled 2017-05-25 (×12): qty 3

## 2017-05-25 NOTE — ED Triage Notes (Signed)
Pt arrived via EMS from home for reports of SOB that began last night. EMS reports on arrival pt room air SpO2 68% and absent lung sounds. EMS placed pt on CPAP, SpO2 increased to 99%. EMS administered duoneb x 2, and solu-medrol 125 mg. EMS reports ST, HR 120s, CBG 153.

## 2017-05-25 NOTE — ED Provider Notes (Signed)
[facility] Emergency Department Provider Note  ____________________________________________   First MD Initiated Contact with Patient 05/25/17 [phone]     (approximate)  I have reviewed the triage vital signs and the nursing notes.   HISTORY  Chief Complaint Respiratory Distress   HPI Paige Kelley is a 66 y.o. female with a history of CHF as well as COPD who is presenting to the emergency department today with shortness of breath that started last night. She is denying any chest pain. EMS arrived to find the patient very diaphoretic and saturating 68% on her home 2 L of nasal cannula oxygen. The patient was placed on CPAP and given 125 mg of Solu-Medrol as well as 2 DuoNeb. She is now able to speak in several word sentences per EMS. She was also found to be tripoding initially.   Past Medical History:  Diagnosis Date  . Alcohol use   . Chronic systolic CHF (congestive heart failure) (HCC)    a. TTE 04/2017: EF 20-25%, unable to exclude RWMA, GR2DD, mild AI, severe MR, mild biatrial enlargement, mod-sev TR, moderately elevated PASP  . Hypertension     Patient Active Problem List   Diagnosis Date Noted  . Acute systolic CHF (congestive heart failure) (HCC) 05/03/2017  . Acute respiratory failure with hypoxia (HCC) 04/30/2017  . COPD with acute exacerbation (HCC) 03/10/2017  . Alcohol use 03/10/2017  . HTN (hypertension) 03/10/2017  . Hyponatremia 03/10/2017    Past Surgical History:  Procedure Laterality Date  . NO PAST SURGERIES      Prior to Admission medications   Medication Sig Start Date End Date Taking? Authorizing Provider  albuterol (PROVENTIL) (2.5 MG/3ML) 0.083% nebulizer solution Take 3 mLs (2.5 mg total) by nebulization every 4 (four) hours as needed for wheezing or shortness of breath. 05/04/17   Enid Baas, MD  carvedilol (COREG) 3.125 MG tablet Take 1 tablet (3.125 mg total) by mouth 2 (two) times daily with a meal.  05/04/17   Enid Baas, MD  furosemide (LASIX) 20 MG tablet Take 1 tablet (20 mg total) by mouth daily. 05/05/17   Enid Baas, MD  predniSONE (STERAPRED UNI-PAK 21 TAB) 10 MG (21) TBPK tablet 6 tabs PO x 1 day 5 tabs PO x 1 day 4 tabs PO x 1 day 3 tabs PO x 1 day 2 tabs PO x 1 day 1 tab PO x 1 day and stop 05/04/17   Enid Baas, MD    Allergies Patient has no known allergies.  Family History  Problem Relation Age of Onset  . Hypertension Other     Social History Social History  Substance Use Topics  . Smoking status: Current Every Day Smoker    Packs/day: 1.00    Types: Cigarettes  . Smokeless tobacco: Never Used  . Alcohol use 2.4 oz/week    4 Cans of beer per week    Review of Systems  Constitutional: No fever/chills Eyes: No visual changes. ENT: No sore throat. Cardiovascular: Denies chest pain. Respiratory: as above Gastrointestinal: No abdominal pain.  No nausea, no vomiting.  No diarrhea.  No constipation. Genitourinary: Negative for dysuria. Musculoskeletal: Negative for back pain. Skin: Negative for rash. Neurological: Negative for headaches, focal weakness or numbness.   ____________________________________________   PHYSICAL EXAM:  VITAL SIGNS: ED Triage Vitals [05/25/17 0904]  Enc Vitals Group     BP      Pulse      Resp      Temp  Temp src      SpO2      Weight 129 lb (58.5 kg)     Height 5\' 7"  (1.702 m)     Head Circumference      Peak Flow      Pain Score      Pain Loc      Pain Edu?      Excl. in GC?     Constitutional: Alert and oriented. Diaphoretic. Tolerating the CPAP well. Eyes: Conjunctivae are normal.  Head: Atraumatic. Nose: No congestion/rhinnorhea. Mouth/Throat: Mucous membranes are moist.  Neck: No stridor.   Cardiovascular: Tachycardic, regular rhythm. Grossly normal heart sounds.   Respiratory: Respiratory distress with use of accessory muscles and tachypnea. Severely decreased air movement  throughout all fields. Gastrointestinal: Soft and nontender. No distention.  Musculoskeletal: No lower extremity tenderness nor edema.  No joint effusions. Neurologic:  Normal speech and language. No gross focal neurologic deficits are appreciated. Skin:  Skin is warm, dry and intact. No rash noted. Psychiatric: Mood and affect are normal. Speech and behavior are normal.  ____________________________________________   LABS (all labs ordered are listed, but only abnormal results are displayed)  Labs Reviewed  CBC WITH DIFFERENTIAL/PLATELET - Abnormal; Notable for the following:       Result Value   Neutro Abs 7.6 (*)    All other components within normal limits  BASIC METABOLIC PANEL - Abnormal; Notable for the following:    Chloride 98 (*)    Glucose, Bld 144 (*)    BUN 24 (*)    All other components within normal limits  TROPONIN I - Abnormal; Notable for the following:    Troponin I 0.04 (*)    All other components within normal limits  BLOOD GAS, VENOUS - Abnormal; Notable for the following:    pCO2, Ven 79 (*)    pO2, Ven <31.0 (*)    Bicarbonate 39.8 (*)    Acid-Base Excess 10.3 (*)    All other components within normal limits  BRAIN NATRIURETIC PEPTIDE   ____________________________________________  EKG  ED ECG REPORT I, Arelia LongestSchaevitz,  Seyon Strader M, the attending physician, personally viewed and interpreted this ECG.   Date: 05/25/2017  EKG Time: 904  Rate: 114  Rhythm: sinus tachycardia  Axis: Normal  Intervals:nonspecific intraventricular conduction delay  ST&T Change: No ST segment elevation or depression. T-wave inversions in aVL. T-wave inversion in V6. V6 T-wave inversion appears new. Otherwise there are no other significant changes. ____________________________________________  RADIOLOGY  No active disease on the chest x-ray ____________________________________________   PROCEDURES  Procedure(s) performed:     Procedures  Critical Care performed:  Yes, see critical care note(s) CRITICAL CARE Performed by: Arelia LongestSchaevitz,  Bohdan Macho M   Total critical care time: 35 minutes  Critical care time was exclusive of separately billable procedures and treating other patients.  Critical care was necessary to treat or prevent imminent or life-threatening deterioration.  Critical care was time spent personally by me on the following activities: development of treatment plan with patient and/or surrogate as well as nursing, discussions with consultants, evaluation of patient's response to treatment, examination of patient, obtaining history from patient or surrogate, ordering and performing treatments and interventions, ordering and review of laboratory studies, ordering and review of radiographic studies, pulse oximetry and re-evaluation of patient's condition. ____________________________________________   INITIAL IMPRESSION / ASSESSMENT AND PLAN / ED COURSE  Pertinent labs & imaging results that were available during my care of the patient were reviewed by me and  considered in my medical decision making (see chart for details).  ----------------------------------------- 10:03 AM on 05/25/2017 -----------------------------------------  Patient without any disease on her chest x-ray. The nitroglycerin will be discontinued. Patient is awake and alert in her heart rate now is 97 bpm. She still has severely decreased air movement throughout. She'll be continued with albuterol. She'll be admitted to the hospital. The patient is understanding of the plan and willing to comply. Patient signed out to the hospitalist.      ____________________________________________   FINAL CLINICAL IMPRESSION(S) / ED DIAGNOSES  COPD exacerbation.    NEW MEDICATIONS STARTED DURING THIS VISIT:  New Prescriptions   No medications on file     Note:  This document was prepared using Dragon voice recognition software and may include unintentional dictation  errors.     Myrna Blazer, MD 05/25/17 1003

## 2017-05-25 NOTE — Progress Notes (Signed)
Family Meeting Note  Advance Directive:yes  Today a meeting took place with the patient No family in emergency room.  The following were discussed:Patient's diagnosis: , Patient's progosis: Patient is being admitted with acute on chronic hypoxic respiratory failure due to COPD and CHF exacerbation. She is currently on BiPAP. CODE STATUS discussed. Patient does not want to be intubated. She requests DO NOT RESUSCITATE.  Time spent during discussion:Payten Hobin, MD  16 mins

## 2017-05-25 NOTE — ED Notes (Signed)
Per Dr. Allena KatzPatel BiPAP discontinued. Pt placed on 2L O2 [place].

## 2017-05-25 NOTE — Care Management (Signed)
Patient is readmit from home with concerns per previous RNCM of ETOH addiction involving daughter. RNCM suggested to family that APS report be filed with any concerns. [facility] is [facility] to [facility] however they have struggled to follow care plan with patient as she will not answer the door or phone when they call/arrive to patient's home. Barbara CowerJason with [facility] aware of patient presentation to ICU.

## 2017-05-25 NOTE — Progress Notes (Signed)
Pt remains alert and oriented, one complaint of headache relieved with tylenol.  She remains on Bipap, currently at 28%.  Attempt was made to wean her from Bipap around 1600, but immediately Respiratory rate increased to 40's, with assessory muscle use, and  Pt self report of feeling like she was "struggling to breathe".  At present with Bipap and work of breathing, have kept pt NPO.  Sinus tachycardia on telemetry, lungs very diminished bilaterally.  Pt received 20 mg IV lasix, pt has diuresed 350 plus one incontinent episode of urine.  Pt reported she was extremely short of breath with all the turning of getting on the bedpan, therefore an external catheter was placed, which has drained 25 ml urine since.  Vitals signs stable, afebrile.  Have spoken with pt's daughter Alcario Droughtrica and her brother Caryn BeeKevin and updated them on pt's current status and plan of care.

## 2017-05-25 NOTE — Progress Notes (Signed)
[facility]  Patient Status: Active  [facility] is providing the following services:SN/PT  If patient discharges after hours, please call [phone].   Scherrie GerlachJason E Hinton 05/25/2017, 4:09 PM

## 2017-05-25 NOTE — Progress Notes (Addendum)
Patient wanting to trial off of BIPAP.  RN and RT in room with patient as we placed patient on [place] 4lpm .  PAtient unable to tolerate being off BIPAP, though oxygen saturations remained greater than 95%,  her RR became greater than 40 and her breathing became very labored.  Patient placed back on BIPAP.

## 2017-05-25 NOTE — Consult Note (Signed)
Name: Paige Kelley MRN: [id number] DOB: Jan 03, 1951    ADMISSION DATE:  05/25/2017 CONSULTATION DATE: 05/25/2017  REFERRING MD : Dr. Allena Katz  CHIEF COMPLAINT: Shortness of breath  BRIEF PATIENT DESCRIPTION:  66 year old female admitted 08/9 with acute on chronic hypoxic respiratory failure secondary to AECOPD.    SIGNIFICANT EVENTS  08/9-Pt admitted to Stepdown Unit   STUDIES:  None   HISTORY OF PRESENT ILLNESS:   This is a 66 year old female with a past medical history of hypertension, chronic systolic CHF, former ETOH abuse, and COPD. She presented to [facility] ER 08/9 with complaints of shortness of breath onset of symptoms the night of 08/8. She notified EMS upon their arrival she was found diaphoretic, in a tripod position, and hypoxic O2 sats 60% on her home O2 at 2L via nasal cannula. Therefore, she was placed on CPAP and given 125 mg of IV Solu-Medrol and 2 DuoNeb treatments.  In the ER she was placed on continuous BiPAP for a few hours, ER physician attempted to transition to 2L O2 via nasal cannula, however due to continued  respiratory distress she was placed back on BiPAP. Chest x-ray negative no signs of pneumonia. She was subsequently admitted to the stepdown unit by the hospitalist team for further workup and treatment PCCM consulted  PAST MEDICAL HISTORY :   has a past medical history of Alcohol use; Chronic systolic CHF (congestive heart failure) (HCC); and Hypertension.  has a past surgical history that includes No past surgeries. Prior to Admission medications   Medication Sig Start Date End Date Taking? Authorizing Provider  carvedilol (COREG) 3.125 MG tablet Take 1 tablet (3.125 mg total) by mouth 2 (two) times daily with a meal. 05/04/17  Yes Enid Baas, MD  furosemide (LASIX) 20 MG tablet Take 1 tablet (20 mg total) by mouth daily. 05/05/17  Yes Enid Baas, MD  albuterol (PROVENTIL) (2.5 MG/3ML) 0.083% nebulizer solution Take 3 mLs (2.5 mg total) by  nebulization every 4 (four) hours as needed for wheezing or shortness of breath. Patient not taking: Reported on 05/25/2017 05/04/17   Enid Baas, MD   No Known Allergies  FAMILY HISTORY:  family history includes Hypertension in her other. SOCIAL HISTORY:  reports that she has been smoking Cigarettes.  She has been smoking about 1.00 pack per day. She has never used smokeless tobacco. She reports that she drinks about 2.4 oz of alcohol per week . She reports that she does not use drugs.  REVIEW OF SYSTEMS:  Positives in BOLD  Constitutional: fever, chills, weight loss, malaise/fatigue and diaphoresis.  HENT: Negative for hearing loss, ear pain, nosebleeds, congestion, sore throat, neck pain, tinnitus and ear discharge.   Eyes: Negative for blurred vision, double vision, photophobia, pain, discharge and redness.  Respiratory: cough, hemoptysis, sputum production, shortness of breath, wheezing and stridor.   Cardiovascular: Negative for chest pain, palpitations, orthopnea, claudication, leg swelling and PND.  Gastrointestinal: Negative for heartburn, nausea, vomiting, abdominal pain, diarrhea, constipation, blood in stool and melena.  Genitourinary: Negative for dysuria, urgency, frequency, hematuria and flank pain.  Musculoskeletal: Negative for myalgias, back pain, joint pain and falls.  Skin: Negative for itching and rash.  Neurological: Negative for dizziness, tingling, tremors, sensory change, speech change, focal weakness, seizures, loss of consciousness, weakness and headaches.  Endo/Heme/Allergies: Negative for environmental allergies and polydipsia. Does not bruise/bleed easily.  SUBJECTIVE:  Patient states her breathing has improved since placed back on BiPAP.  VITAL SIGNS: Temp:  [97.6 F (36.4 C)] 97.6  F (36.4 C) (08/09 0909) Pulse Rate:  [50-113] 113 (08/09 1100) Resp:  [19-34] 19 (08/09 1100) BP: (106-151)/(64-123) 143/90 (08/09 1100) SpO2:  [92 %-100 %] 92 % (08/09  1100) Weight:  [58.5 kg (129 lb)] 58.5 kg (129 lb) (08/09 0904)  PHYSICAL EXAMINATION: General: Chronically ill-appearing Caucasian female, NAD Neuro: Alert and oriented, follows commands HEENT: Supple, no JVD Cardiovascular: Sinus tach, S1-S2, no M/R/G Lungs: Diffusely diminished throughout, even, nonlabored on continuous BiPAP; no wheezes, crackles, rales, or rhonchi Abdomen: Hypoactive bowel sounds 4, ecchymotic area taut at LLQ, nontender Musculoskeletal: Normal bulk and tone, no edema Skin: Intact scattered ecchymoses, no rashes or lesions   Recent Labs Lab 05/25/17 0906  NA 137  K 4.6  CL 98*  CO2 28  BUN 24*  CREATININE 0.70  GLUCOSE 144*    Recent Labs Lab 05/25/17 0906  HGB 12.7  HCT 38.0  WBC 10.3  PLT 369   Dg Chest 1 View  Result Date: 05/25/2017 CLINICAL DATA:  Dyspnea EXAM: CHEST 1 VIEW COMPARISON:  04/30/2017 chest radiograph. FINDINGS: Right rotated chest radiograph. Stable cardiomediastinal silhouette with top-normal heart size. No pneumothorax. No pleural effusion. Lungs appear clear, with no acute consolidative airspace disease and no pulmonary edema. IMPRESSION: No active disease. Electronically Signed   By: Delbert PhenixJason A Poff M.D.   On: 05/25/2017 09:43    ASSESSMENT / PLAN: Acute on chronic hypoxic respiratory failure secondary to AECOPD Hx: Chronic systolic CHF, HTN, and Former ETOH Abuse P: Continuous BiPAP wean as tolerated Maintain O2 sats 88% to 92% IV steroids Scheduled and prn bronchodilator therapy Nebulized steroids IV Lasix 20 mg q12 hours Prn chest x-ray Continue outpatient carvedilol  Sonda Rumbleana Georgia Baria, AGNP  Pulmonary/Critical Care Pager [phone] (please enter 7 digits) PCCM Consult Pager [phone] (please enter 7 digits)

## 2017-05-25 NOTE — ED Notes (Signed)
Pt SpO2 92% on 2L O2 via [place]. Pt states shortness of breath is returning. Pt states she does not feel as good as she did on BiPAP. Dr. Allena KatzPatel notified. Orders received to return to BiPAP.

## 2017-05-25 NOTE — H&P (Signed)
[facility] [place] at [facility]   PATIENT NAME: Paige Kelley    MR#:  [id number]  DATE OF BIRTH:  [birth year]  DATE OF ADMISSION:  05/25/2017  PRIMARY CARE PHYSICIAN: Paige HolmSharpe, Paige M, MD   REQUESTING/REFERRING PHYSICIAN: Dr Paige Kelley  CHIEF COMPLAINT:  Increasing shortness of breath 1-2 days  HISTORY OF PRESENT ILLNESS:  Paige Kelley  is a 66 y.o. female with a known history of Alcohol abuse, chronic systolic congestive heart failure. 25%, COPD with ongoing tobacco abuse , chronic home oxygen comes to the emergency room with increasing shortness of breath. Patient appears very tight and unable to move much air. She was placed on BiPAP. Her sats were down in the 60s to 70s initially when EMS arrived. During my evaluation patient was 100% on BiPAP. We try to take patient off BiPAP however she remained 92% on 2 L nasal cannula oxygen but she was not feeling well and had increased work of breathing and hence placed back on BiPAP again. Patient is being admitted to stepdown for acute on chronic hypoxic respiratory failure secondary to acute on chronic COPD and congestive heart failure systolic. Patient received IV Solu-Medrol breathing treatment in the ER. I have started patient on some Lasix IV also.  PAST MEDICAL HISTORY:   Past Medical History:  Diagnosis Date  . Alcohol use   . Chronic systolic CHF (congestive heart failure) (HCC)    a. TTE 04/2017: EF 20-25%, unable to exclude RWMA, GR2DD, mild AI, severe MR, mild biatrial enlargement, mod-sev TR, moderately elevated PASP  . Hypertension     PAST SURGICAL HISTOIRY:   Past Surgical History:  Procedure Laterality Date  . NO PAST SURGERIES      SOCIAL HISTORY:   Social History  Substance Use Topics  . Smoking status: Current Every Day Smoker    Packs/day: 1.00    Types: Cigarettes  . Smokeless tobacco: Never Used  . Alcohol use 2.4 oz/week    4 Cans of beer per week    FAMILY HISTORY:    Family History  Problem Relation Age of Onset  . Hypertension Other     DRUG ALLERGIES:  No Known Allergies  REVIEW OF SYSTEMS:  Review of Systems  Unable to perform ROS: Mental acuity     MEDICATIONS AT HOME:   Prior to Admission medications   Medication Sig Start Date End Date Taking? Authorizing Provider  carvedilol (COREG) 3.125 MG tablet Take 1 tablet (3.125 mg total) by mouth 2 (two) times daily with a meal. 05/04/17  Yes Enid BaasKalisetti, Radhika, MD  furosemide (LASIX) 20 MG tablet Take 1 tablet (20 mg total) by mouth daily. 05/05/17  Yes Enid BaasKalisetti, Radhika, MD  albuterol (PROVENTIL) (2.5 MG/3ML) 0.083% nebulizer solution Take 3 mLs (2.5 mg total) by nebulization every 4 (four) hours as needed for wheezing or shortness of breath. Patient not taking: Reported on 05/25/2017 05/04/17   Enid BaasKalisetti, Radhika, MD      VITAL SIGNS:  Blood pressure (!) 143/90, pulse (!) 113, temperature 97.6 F (36.4 C), temperature source Axillary, resp. rate 19, height 5\' 7"  (1.702 Kelley), weight 58.5 kg (129 lb), SpO2 92 %.  PHYSICAL EXAMINATION:  GENERAL:  66 y.o.-year-old patient lying in the bed with Moderate acute distress. Thin cachectic EYES: Pupils equal, round, reactive to light and accommodation. No scleral icterus. Extraocular muscles intact.  HEENT: Head atraumatic, normocephalic. Oropharynx and nasopharynx clear. BiPAP NECK:  Supple, no jugular venous distention. No thyroid enlargement, no tenderness. JVD +  LUNGS: Distant breath sounds bilaterally, no wheezing, rales,rhonchi or crepitation. No use of accessory muscles of respiration. Tachypneic CARDIOVASCULAR: S1, S2 normal. No murmurs, rubs, or gallops.  ABDOMEN: Soft, nontender, nondistended. Bowel sounds present. No organomegaly or mass.  EXTREMITIES: No pedal edema, cyanosis, or clubbing.  NEUROLOGIC: Grossly nonfocal Muscle strength 5/5 in all extremities. Sensation intact. Gait not checked.  PSYCHIATRIC: The patient is alert and oriented  x 3.  SKIN: No obvious rash, lesion, or ulcer.   LABORATORY PANEL:   CBC  Recent Labs Lab 05/25/17 0906  WBC 10.3  HGB 12.7  HCT 38.0  PLT 369   ------------------------------------------------------------------------------------------------------------------  Chemistries   Recent Labs Lab 05/25/17 0906  NA 137  K 4.6  CL 98*  CO2 28  GLUCOSE 144*  BUN 24*  CREATININE 0.70  CALCIUM 9.1   ------------------------------------------------------------------------------------------------------------------  Cardiac Enzymes  Recent Labs Lab 05/25/17 0906  TROPONINI 0.04*   ------------------------------------------------------------------------------------------------------------------  RADIOLOGY:  Dg Chest 1 View  Result Date: 05/25/2017 CLINICAL DATA:  Dyspnea EXAM: CHEST 1 VIEW COMPARISON:  04/30/2017 chest radiograph. FINDINGS: Right rotated chest radiograph. Stable cardiomediastinal silhouette with top-normal heart size. No pneumothorax. No pleural effusion. Lungs appear clear, with no acute consolidative airspace disease and no pulmonary edema. IMPRESSION: No active disease. Electronically Signed   By: Delbert Phenix Kelley.D.   On: 05/25/2017 09:43    EKG:    IMPRESSION AND PLAN:   [name]  is a 65 y.o. female with a known history of Alcohol abuse, chronic systolic congestive heart failure. 25%, COPD with ongoing tobacco abuse , chronic home oxygen comes to the emergency room with increasing shortness of breath. Patient appears very tight and unable to move much air.   1. Acute on chronic hypoxic respiratory failure secondary to COPD/emphysema exacerbation and acute on chronic systolic congestive heart failure. -Admit to ICU stepdown -IV Solu-Medrol, nebulizer, BiPAP, inhalers -IV Lasix 20 mg twice a day -Pulmonary consultation. Spoke with Dr. Belia Heman  2. Acute on chronic congestive heart failure systolic -Elevated BNP -Chest x-ray does not show any pulmonary  edema  -We'll give Lasix 20 twice a day today. Assessment need for IV Lasix tomorrow.  3. Tobacco abuse. Patient continues to smoke about 2-3 cigarettes per day. Smoking cessation advised more than 40 minutes spent.  4. DVT prophylaxis subcutaneous Lovenox.   All the records are reviewed and case discussed with ED provider. Management plans discussed with the patient, family and they are in agreement.  CODE STATUS: DO NOT RESUSCITATE this was discussed with patient  TOTAL CRITICAL TIME TAKING CARE OF THIS PATIENT: 50 minutes.    Bethani Brugger Kelley.D on 05/25/2017 at 11:37 AM  Between 7am to 6pm - Pager - [phone]  After 6pm go to [email or web address] - password [email or web address] [facility]  [facility]  Office  [phone]  CC: Primary care physician; Paige Holm, MD

## 2017-05-25 NOTE — Evaluation (Signed)
Physical Therapy Evaluation Patient Details Name: Paige HollingsheadWanda Sue Reason MRN: [id number] DOB: [birth year] Today's Date: 05/25/2017   History of Present Illness  66 y.o.femalewith history of COPD, Alcohol & tobacco abuse, CHF who was admitted 3 weeks ago with SOB.  Now admitted again with acute on chronic respiratory failure.   Clinical Impression  During ~10 minutes of supine exercises and minimal mobility/transfers has O2 in the high 90s (on BiPAP) and HR hovered ~100 t/o entire session both at rest and with activity.  She was eager to work with PT and did better than she expected and did not have excessive fatigue. Pt is hopeful that she would be able to get home if she is breathing better/not needing BiPAP.  She is on 3 liters at home, overall did well but is not appropriate/safe for home given her current status.    Follow Up Recommendations SNF (Pt hoping to be able to go home if breathing improves)    Equipment Recommendations       Recommendations for Other Services       Precautions / Restrictions Precautions Precautions: Fall Restrictions Weight Bearing Restrictions: No      Mobility  Bed Mobility Overal bed mobility: Needs Assistance Bed Mobility: Supine to Sit;Sit to Supine     Supine to sit: Min assist Sit to supine: Min assist   General bed mobility comments: Pt showed good effort in getting to/from sitting.  Was fatigued with the effort and needed rails but generally did well  Transfers Overall transfer level: Needs assistance Equipment used: Rolling walker (2 wheeled) Transfers: Sit to/from Stand Sit to Stand: Min assist;Min guard         General transfer comment[facility]  Pt was heavily leaning on bed rails/BSC for balance.  Unable to attempt stepping w/o signficant UE use.  Ambulation/Gait             General Gait Details: Pt not appropriate for ambulation, on BiPAP, weak  Stairs            Wheelchair Mobility    Modified  Rankin (Stroke Patients Only)       Balance Overall balance assessment: Needs assistance Sitting-balance support: No upper extremity supported;Feet supported Sitting balance-Leahy Scale: Good     Standing balance support: Bilateral upper extremity supported Standing balance-Leahy Scale: Fair                               Pertinent Vitals/Pain Pain Assessment: No/denies pain    Home Living Family/patient expects to be discharged to:: Private residence Living Arrangements: Children Available Help at Discharge: Family;Available 24 hours/day Type of Home: House Home Access: Stairs to enter Entrance Stairs-Rails: Right Entrance Stairs-Number of Steps: 3 Home Layout: One level        Prior Function Level of Independence: Independent         Comments: reports she does not leave to house a lot, but does not need AD and has not had falls     Hand Dominance        Extremity/Trunk Assessment   Upper Extremity Assessment Upper Extremity Assessment: Generalized weakness    Lower Extremity Assessment Lower Extremity Assessment: Generalized weakness       Communication   Communication:  (on BiPAP, limited ability to interact efficiently )  Cognition Arousal/Alertness: Awake/alert Behavior During Therapy: WFL for tasks assessed/performed Overall Cognitive Status: Within Functional Limits for tasks assessed  General Comments      Exercises General Exercises - Lower Extremity Ankle Circles/Pumps: Strengthening;10 reps Heel Slides: Strengthening;10 reps Hip ABduction/ADduction: Strengthening;10 reps Straight Leg Raises: AROM;10 reps   Assessment/Plan    PT Assessment Patient needs continued PT services  PT Problem List Decreased strength;Decreased activity tolerance;Decreased balance;Decreased knowledge of use of DME       PT Treatment Interventions DME instruction;Gait training;Stair  training;Functional mobility training;Neuromuscular re-education;Balance training;Therapeutic exercise;Therapeutic activities;Patient/family education    PT Goals (Current goals can be found in the Care Plan section)  Acute Rehab PT Goals Patient Stated Goal: To return home and be able to do more without feeling tired PT Goal Formulation: With patient Time For Goal Achievement: 06/08/17 Potential to Achieve Goals: Fair    Frequency Min 2X/week   Barriers to discharge        Co-evaluation               AM-PAC PT "6 Clicks" Daily Activity  Outcome Measure Difficulty turning over in bed (including adjusting bedclothes, sheets and blankets)?: A Little Difficulty moving from lying on back to sitting on the side of the bed? : A Little Difficulty sitting down on and standing up from a chair with arms (e.g., wheelchair, bedside commode, etc,.)?: A Little Help needed moving to and from a bed to chair (including a wheelchair)?: A Little Help needed walking in hospital room?: A Lot Help needed climbing 3-5 steps with a railing? : A Lot 6 Click Score: 16    End of Session Equipment Utilized During Treatment: Gait belt;Oxygen Activity Tolerance: Patient limited by fatigue Patient left: in bed;with nursing/sitter in room;with call bell/phone within reach   PT Visit Diagnosis: Difficulty in walking, not elsewhere classified (R26.2);Muscle weakness (generalized) (M62.81)    Time: 1340-1405 PT Time Calculation (min) (ACUTE ONLY): 25 min   Charges:   PT Evaluation $PT Eval Moderate Complexity: 1 Mod PT Treatments $Therapeutic Exercise: 8-22 mins   PT G Codes:   PT G-Codes **NOT FOR INPATIENT CLASS** Functional Assessment Tool Used: AM-PAC 6 Clicks Basic Mobility Functional Limitation: Mobility: Walking and moving around Mobility: Walking and Moving Around Current Status (Z6109): At least 40 percent but less than 60 percent impaired, limited or restricted Mobility: Walking and  Moving Around Goal Status [phone]): At least 1 percent but less than 20 percent impaired, limited or restricted    Malachi Pro, DPT 05/25/2017, 3:14 PM

## 2017-05-25 NOTE — Care Management (Addendum)
RNCM consult received for LTAC. Screening request sent to [facility] and [facility]). I met with patient and she states she is from home with her daughter Danae Chen. She states she is safe to return home at discharge. She states Erica's number has changed and "she's supposed to be up here". Patient states she wants her brother Lennette Bihari [phone] to know she's here in the hospital. Per Lanny Hurst number is [phone]. Patient offered choice of LTAC and patient has declined transfer to [facility]. Both [facility] and [facility] notified that patient does not want to pursue LTAC. Lennette Bihari updated that patient has declined LTAC.

## 2017-05-25 NOTE — Progress Notes (Signed)
Pt placed on 3LNC at this time maintaining saturations at  96%.

## 2017-05-25 NOTE — Progress Notes (Signed)
2350  

## 2017-05-25 NOTE — ED Notes (Signed)
Date and time results received: 05/25/17 9:49 AM  (use smartphrase ".now" to insert current time)  Test: troponion Critical Value: 0.04  Name of Provider Notified: Dr. Pershing ProudSchaevitz  Orders Received? Or Actions Taken?: Orders Received - See Orders for details

## 2017-05-26 DIAGNOSIS — E43 Unspecified severe protein-calorie malnutrition: Secondary | ICD-10-CM | POA: Insufficient documentation

## 2017-05-26 MED ORDER — ADULT MULTIVITAMIN W/MINERALS CH
1.0000 | ORAL_TABLET | Freq: Every day | ORAL | Status: DC
  Administered 2017-05-26 – 2017-05-30 (×5): 1 via ORAL
  Filled 2017-05-26 (×5): qty 1

## 2017-05-26 MED ORDER — ENSURE ENLIVE PO LIQD
237.0000 mL | Freq: Two times a day (BID) | ORAL | Status: DC
  Administered 2017-05-26 – 2017-05-30 (×8): 237 mL via ORAL

## 2017-05-26 MED ORDER — FUROSEMIDE 20 MG PO TABS
20.0000 mg | ORAL_TABLET | Freq: Every day | ORAL | Status: DC
  Administered 2017-05-26 – 2017-05-27 (×2): 20 mg via ORAL
  Filled 2017-05-26 (×2): qty 1

## 2017-05-26 NOTE — Progress Notes (Signed)
Patient transferred by bed to room 240 with Diannia Rud[place]Kara, [place].  Patient transferred on tele and o2.  Alert with no distress noted when leaving ICU.

## 2017-05-26 NOTE — Progress Notes (Signed)
Report called to Bradly BienenstockKisha, RN on 2A.  Patient will be moved to room 240.

## 2017-05-26 NOTE — Progress Notes (Addendum)
Patient arrived on unit from ICU. Pt is AAOx3. IV intact. Patient oriented to the room. VS obtained, call bell in reach. Will continue to monitor.

## 2017-05-26 NOTE — Progress Notes (Addendum)
Initial Nutrition Assessment  DOCUMENTATION CODES:   Severe malnutrition in context of chronic illness  INTERVENTION:   Ensure Enlive po BID, each supplement provides 350 kcal and 20 grams of protein  MVI  NUTRITION DIAGNOSIS:   Malnutrition (severe) related to  (COPD, CHF, etoh abuse ) as evidenced by severe depletion of muscle mass, severe depletion of body fat.  GOAL:   Patient will meet greater than or equal to 90% of their needs  MONITOR:   PO intake, Supplement acceptance, Labs, Weight trends, I & O's  REASON FOR ASSESSMENT:   Rounds    ASSESSMENT:   66 year old female with a past medical history of hypertension, chronic systolic CHF, former ETOH abuse, and COPD. She presented to [facility] ER 08/9 with complaints of shortness of breath onset of symptoms the night of 08/8   Met with pt in room today. Pt reports good appetite and oral intake up until one day pta r/t respiratory distress. Pt reports that she is usually a good eater at home and she drinks one chocolate Ensure a day. Pt is currently eating 100% of meals. Pt reports that she weighs herself every day and reports that her weight is stable. RD discussed the importance of adequate protein intake with pt; RD will order supplements.   Medications reviewed and include: lovenox, lasix, solu-medrol, senokot  Labs reviewed: Cl 98(L), BUN 24(H) BNP- 1903(H)  Nutrition-Focused physical exam completed. Findings are moderate to severe fat depletions in arms and chest, severe muscle depletions over entire body, and no edema.   Diet Order:  Diet 2 gram sodium Room service appropriate? Yes; Fluid consistency: Thin  Skin:  Reviewed, no issues  Last BM:  pta  Height:   Ht Readings from Last 1 Encounters:  05/25/17 _0  (1.702 m)    Weight:   Wt Readings from Last 1 Encounters:  05/25/17 134 lb 0.6 oz (60.8 kg)    Ideal Body Weight:  61.4 kg  BMI:  Body mass index is 20.99 kg/m.  Estimated Nutritional Needs:    Kcal:  1800-2100kcal/day   Protein:  91-104g/day   Fluid:  >1.8L/day   EDUCATION NEEDS:   Education needs addressed  Koleen Distance MS, RD, LDN Pager #[phone] After Hours Pager: [phone]

## 2017-05-26 NOTE — Consult Note (Signed)
Name: Paige HollingsheadWanda Sue Kelley MRN: [id number] DOB: 1951/08/04    ADMISSION DATE:  05/25/2017 CONSULTATION DATE: 05/25/2017  REFERRING MD : Dr. Allena KatzPatel  CHIEF COMPLAINT: Shortness of breath  BRIEF PATIENT DESCRIPTION:  66 year old female admitted 08/9 with acute on chronic hypoxic respiratory failure secondary to AECOPD.    SIGNIFICANT EVENTS  08/9-Pt admitted to Stepdown Unit   STUDIES:  None   Previous history of present illness  This is a 66 year old female with a past medical history of hypertension, chronic systolic CHF, former ETOH abuse, and COPD. [facility] with complaints of shortness of breath onset of symptoms the night of 08/8. She notified EMS upon their arrival she was found diaphoretic, in a tripod position, and hypoxic O2 sats 60% on her home O2 at 2L via nasal cannula. Therefore, she was placed on CPAP and given 125 mg of IV Solu-Medrol and 2 DuoNeb treatments.  In the ER she was placed on continuous BiPAP for a few hours, ER physician attempted to transition to 2L O2 via nasal cannula, however due to continued  respiratory distress she was placed back on BiPAP. Chest x-ray negative no signs of pneumonia. She was subsequently admitted to the stepdown unit by the hospitalist team for further workup and treatment PCCM consulted   Subjective Patient feels better since last night Patient off BiPAP Respiratory status much improved Patient received steroids antibiotics and bronchodilator therapy REVIEW OF SYSTEMS:  Positives in BOLD  Constitutional: fever, chills, weight loss, + malaise/fatigue  HENT: Negative for hearing loss, ear pain, nosebleeds, congestion, sore throat, neck pain, tinnitus and ear discharge.   Eyes: Negative for blurred vision, double vision, photophobia, pain, discharge and redness.  Respiratory: cough, hemoptysis, sputum production, shortness of breath, wheezing and stridor.   Cardiovascular: Negative for chest pain, palpitations,  orthopnea, claudication, leg swelling and PND.  Gastrointestinal: Negative for heartburn, nausea, vomiting, abdominal pain, diarrhea, constipation, blood in stool and melena.  Genitourinary: Negative for dysuria, urgency, frequency, hematuria and flank pain.  Musculoskeletal: Negative for myalgias, back pain, joint pain and falls.  Skin: Negative for itching and rash.  Neurological: Negative for dizziness, tingling, tremors, sensory change, speech change, focal weakness, seizures, loss of consciousness, weakness and headaches.  Endo/Heme/Allergies: Negative for environmental allergies and polydipsia. Does not bruise/bleed easily.   VITAL SIGNS: Temp:  [97.6 F (36.4 C)-98.5 F (36.9 C)] 98.5 F (36.9 C) (08/10 0400) Pulse Rate:  [50-114] 114 (08/10 0700) Resp:  [16-34] 29 (08/10 0700) BP: (91-151)/(51-130) 118/73 (08/10 0700) SpO2:  [92 %-100 %] 94 % (08/10 0700) FiO2 (%):  [28 %] 28 % (08/09 1956) Weight:  [129 lb (58.5 kg)-134 lb 0.6 oz (60.8 kg)] 134 lb 0.6 oz (60.8 kg) (08/09 1149) PHYSICAL EXAMINATION: General: Chronically ill-appearing Caucasian female, NAD Neuro: Alert and oriented, follows commands HEENT: Supple, no JVD Cardiovascular: Sinus tach, S1-S2, no M/R/G Lungs: Diffusely diminished throughout, even, nonlabored on continuous BiPAP; no wheezes, crackles, rales, or rhonchi Abdomen: Hypoactive bowel sounds 4, ecchymotic area taut at LLQ, nontender Musculoskeletal: Normal bulk and tone, no edema Skin: Intact scattered ecchymoses, no rashes or lesions    Recent Labs Lab 05/25/17 0906  NA 137  K 4.6  CL 98*  CO2 28  BUN 24*  CREATININE 0.70  GLUCOSE 144*    Recent Labs Lab 05/25/17 0906  HGB 12.7  HCT 38.0  WBC 10.3  PLT 369    ASSESSMENT / PLAN: Acute on chronic hypoxic respiratory failure secondary to AECOPD With  end-stage systolic CHF Hx: Chronic systolic CHF, HTN, and Former ETOH Abuse P: Wean BiPAP as tolerated Maintain O2 sats 88% to  92% Continue IV steroids Continue Scheduled and prn bronchodilator therapy Nebulized steroids IV Lasix 20 mg q12 hours Prn chest x-ray Continue outpatient carvedilol   Patient satisfied with Plan of action and management. All questions answered  Lucie Leather, M.D.  [facility]  Medical Director [facility] Medical Director [facility] Cardio-Pulmonary Department

## 2017-05-26 NOTE — Progress Notes (Addendum)
[facility] - [place] [facility]   PATIENT NAME: Paige Kelley    MR#:  [id number]  DATE OF BIRTH:  01/06/1951  SUBJECTIVE:  CHIEF COMPLAINT:   Chief Complaint  Patient presents with  . Respiratory Distress     Came with hypoxia, initially required BIPAP in ICU, now improved and on 3-4 ltr nasal canula oxygen, transferred to floor.  REVIEW OF SYSTEMS:  CONSTITUTIONAL: No fever, fatigue or weakness.  EYES: No blurred or double vision.  EARS, NOSE, AND THROAT: No tinnitus or ear pain.  RESPIRATORY: No cough,positive for shortness of breath, wheezing , no hemoptysis.  CARDIOVASCULAR: No chest pain, orthopnea, edema.  GASTROINTESTINAL: No nausea, vomiting, diarrhea or abdominal pain.  GENITOURINARY: No dysuria, hematuria.  ENDOCRINE: No polyuria, nocturia,  HEMATOLOGY: No anemia, easy bruising or bleeding SKIN: No rash or lesion. MUSCULOSKELETAL: No joint pain or arthritis.   NEUROLOGIC: No tingling, numbness, weakness.  PSYCHIATRY: No anxiety or depression.   ROS  DRUG ALLERGIES:  No Known Allergies  VITALS:  Blood pressure 129/69, pulse (!) 112, temperature 98.1 F (36.7 C), temperature source Oral, resp. rate 16, height 5\' 7"  (1.702 m), weight 60.8 kg (134 lb 0.6 oz), SpO2 96 %.  PHYSICAL EXAMINATION:  GENERAL:  66 y.o.-year-old patient lying in the bed with no acute distress.  EYES: Pupils equal, round, reactive to light and accommodation. No scleral icterus. Extraocular muscles intact.  HEENT: Head atraumatic, normocephalic. Oropharynx and nasopharynx clear.  NECK:  Supple, no jugular venous distention. No thyroid enlargement, no tenderness.  LUNGS: Normal breath sounds bilaterally, have wheezing, no crepitation. No use of accessory muscles of respiration.  CARDIOVASCULAR: S1, S2 normal. No murmurs, rubs, or gallops.  ABDOMEN: Soft, nontender, nondistended. Bowel sounds present. No organomegaly or mass.  EXTREMITIES: No pedal edema, cyanosis, or  clubbing.  NEUROLOGIC: Cranial nerves II through XII are intact. Muscle strength 4/5 in all extremities. Sensation intact. Gait not checked.  PSYCHIATRIC: The patient is alert and oriented x 3.  SKIN: No obvious rash, lesion, or ulcer.   Physical Exam LABORATORY PANEL:   CBC  Recent Labs Lab 05/25/17 0906  WBC 10.3  HGB 12.7  HCT 38.0  PLT 369   ------------------------------------------------------------------------------------------------------------------  Chemistries   Recent Labs Lab 05/25/17 0906  NA 137  K 4.6  CL 98*  CO2 28  GLUCOSE 144*  BUN 24*  CREATININE 0.70  CALCIUM 9.1   ------------------------------------------------------------------------------------------------------------------  Cardiac Enzymes  Recent Labs Lab 05/25/17 0906  TROPONINI 0.04*   ------------------------------------------------------------------------------------------------------------------  RADIOLOGY:  Dg Chest 1 View  Result Date: 05/25/2017 CLINICAL DATA:  Dyspnea EXAM: CHEST 1 VIEW COMPARISON:  04/30/2017 chest radiograph. FINDINGS: Right rotated chest radiograph. Stable cardiomediastinal silhouette with top-normal heart size. No pneumothorax. No pleural effusion. Lungs appear clear, with no acute consolidative airspace disease and no pulmonary edema. IMPRESSION: No active disease. Electronically Signed   By: Delbert Phenix M.D.   On: 05/25/2017 09:43    ASSESSMENT AND PLAN:   Active Problems:   Acute on chronic respiratory failure (HCC)   Protein-calorie malnutrition, severe   Paige Kelley  is a 66 y.o. female with a known history of Alcohol abuse, chronic systolic congestive heart failure. 25%, COPD with ongoing tobacco abuse , chronic home oxygen comes to the emergency room with increasing shortness of breath. Patient appears very tight and unable to move much air.   1. Acute on chronic hypoxic respiratory failure secondary to COPD/emphysema exacerbation and acute  on chronic systolic congestive heart  failure. - IV Solu-Medrol, nebulizer, BiPAP, inhalers - IV Lasix 20 mg twice a day - Pulmonary consultation appreciated with Dr. Belia Hemankasa - have good diuresis, Improved now., on nasal canula.  2. Acute on chronic congestive heart failure systolic -Elevated BNP -Chest x-ray does not show any pulmonary edema  - responded to IV lasix, now will switch to oral.  3. Tobacco abuse. Patient continues to smoke about 2-3 cigarettes per day. Smoking cessation advised more than 4 minutes spent.  4. DVT prophylaxis subcutaneous Lovenox.   All the records are reviewed and case discussed with Care Management/Social Workerr. Management plans discussed with the patient, family and they are in agreement.  CODE STATUS: DNR  TOTAL TIME TAKING CARE OF THIS PATIENT: 35 minutes.     POSSIBLE D/C IN 1-2 DAYS, DEPENDING ON CLINICAL CONDITION.   [name], Grey Schlauch M.D on 05/26/2017   Between 7am to 6pm - Pager - [phone]  After 6pm go to [email or web address] - Social research officer, governmentpassword EPAS [facility]  [facility]  Office  [phone]  CC: Primary care physician; Pricilla HolmSharpe, Leslie M, MD  Note: This dictation was prepared with Dragon dictation along with smaller phrase technology. Any transcriptional errors that result from this process are unintentional.

## 2017-05-26 NOTE — Progress Notes (Signed)
Agree with current assessment. External catheter no longer in place, BSC at bedside.

## 2017-05-27 LAB — URINALYSIS, COMPLETE (UACMP) WITH MICROSCOPIC
BILIRUBIN URINE: NEGATIVE
Bacteria, UA: NONE SEEN
GLUCOSE, UA: NEGATIVE mg/dL
Hgb urine dipstick: NEGATIVE
KETONES UR: NEGATIVE mg/dL
NITRITE: NEGATIVE
PH: 7 (ref 5.0–8.0)
Protein, ur: NEGATIVE mg/dL
SPECIFIC GRAVITY, URINE: 1.018 (ref 1.005–1.030)

## 2017-05-27 LAB — BASIC METABOLIC PANEL
ANION GAP: 11 (ref 5–15)
BUN: 44 mg/dL — ABNORMAL HIGH (ref 6–20)
CALCIUM: 8.9 mg/dL (ref 8.9–10.3)
CO2: 32 mmol/L (ref 22–32)
CREATININE: 0.88 mg/dL (ref 0.44–1.00)
Chloride: 94 mmol/L — ABNORMAL LOW (ref 101–111)
Glucose, Bld: 116 mg/dL — ABNORMAL HIGH (ref 65–99)
Potassium: 3.9 mmol/L (ref 3.5–5.1)
SODIUM: 137 mmol/L (ref 135–145)

## 2017-05-27 MED ORDER — IPRATROPIUM-ALBUTEROL 0.5-2.5 (3) MG/3ML IN SOLN
3.0000 mL | Freq: Three times a day (TID) | RESPIRATORY_TRACT | Status: DC
  Administered 2017-05-27 – 2017-05-30 (×9): 3 mL via RESPIRATORY_TRACT
  Filled 2017-05-27 (×10): qty 3

## 2017-05-27 NOTE — Progress Notes (Signed)
Patient had 20 beat run of Vtach while on the bedside commode. Pt AAOx3, denies chest pain or SOB. VS obtained  BP 148/92  HR 110. Will notify MD

## 2017-05-27 NOTE — Progress Notes (Signed)
[employer] - [place] at [facility]   PATIENT NAME: Paige Kelley    MR#:  [id number]  DATE OF BIRTH:  [birth year]  SUBJECTIVE:  CHIEF COMPLAINT:   Chief Complaint  Patient presents with  . Respiratory Distress     Came with hypoxia, initially required BIPAP in ICU, now improved and on 3-4 ltr nasal canula oxygen, transferred to floor.   Her complain of generalized weakness today and feels more fatigued.  REVIEW OF SYSTEMS:  CONSTITUTIONAL: No fever, fatigue or weakness.  EYES: No blurred or double vision.  EARS, NOSE, AND THROAT: No tinnitus or ear pain.  RESPIRATORY: No cough,positive for shortness of breath, wheezing , no hemoptysis.  CARDIOVASCULAR: No chest pain, orthopnea, edema.  GASTROINTESTINAL: No nausea, vomiting, diarrhea or abdominal pain.  GENITOURINARY: No dysuria, hematuria.  ENDOCRINE: No polyuria, nocturia,  HEMATOLOGY: No anemia, easy bruising or bleeding SKIN: No rash or lesion. MUSCULOSKELETAL: No joint pain or arthritis.   NEUROLOGIC: No tingling, numbness, weakness.  PSYCHIATRY: No anxiety or depression.   ROS  DRUG ALLERGIES:  No Known Allergies  VITALS:  Blood pressure 118/66, pulse 96, temperature (!) 97.3 F (36.3 C), temperature source Oral, resp. rate 18, height 5\' 7"  (1.702 m), weight 60.8 kg (134 lb 0.6 oz), SpO2 97 %.  PHYSICAL EXAMINATION:  GENERAL:  66 y.o.-year-old patient lying in the bed with no acute distress.  EYES: Pupils equal, round, reactive to light and accommodation. No scleral icterus. Extraocular muscles intact.  HEENT: Head atraumatic, normocephalic. Oropharynx and nasopharynx clear.  NECK:  Supple, no jugular venous distention. No thyroid enlargement, no tenderness.  LUNGS: Normal breath sounds bilaterally, have no wheezing, no crepitation. No use of accessory muscles of respiration.  CARDIOVASCULAR: S1, S2 normal. No murmurs, rubs, or gallops.  ABDOMEN: Soft, nontender, nondistended. Bowel sounds present.  No organomegaly or mass.  EXTREMITIES: No pedal edema, cyanosis, or clubbing.  NEUROLOGIC: Cranial nerves II through XII are intact. Muscle strength 4/5 in all extremities. Sensation intact. Gait not checked.  PSYCHIATRIC: The patient is alert and oriented x 3.  SKIN: No obvious rash, lesion, or ulcer.   Physical Exam LABORATORY PANEL:   CBC  Recent Labs Lab 05/25/17 0906  WBC 10.3  HGB 12.7  HCT 38.0  PLT 369   ------------------------------------------------------------------------------------------------------------------  Chemistries   Recent Labs Lab 05/27/17 0422  NA 137  K 3.9  CL 94*  CO2 32  GLUCOSE 116*  BUN 44*  CREATININE 0.88  CALCIUM 8.9   ------------------------------------------------------------------------------------------------------------------  Cardiac Enzymes  Recent Labs Lab 05/25/17 0906  TROPONINI 0.04*   ------------------------------------------------------------------------------------------------------------------  RADIOLOGY:  No results found.  ASSESSMENT AND PLAN:   Active Problems:   Acute on chronic respiratory failure (HCC)   Protein-calorie malnutrition, severe   Paige Kelley  is a 66 y.o. female with a known history of Alcohol abuse, chronic systolic congestive heart failure. 25%, COPD with ongoing tobacco abuse , chronic home oxygen comes to the emergency room with increasing shortness of breath. Patient appears very tight and unable to move much air.   1. Acute on chronic hypoxic respiratory failure secondary to COPD/emphysema exacerbation and acute on chronic systolic congestive heart failure. - IV Solu-Medrol, nebulizer, BiPAP, inhalers - IV Lasix 20 mg twice a day- now stop today as pt have fatigue, likely over diuresis. - Pulmonary consultation appreciated with Dr. Belia Heman - have good diuresis, Improved now., on nasal canula.  2. Acute on chronic congestive heart failure systolic -Elevated BNP -Chest x-ray  does  not show any pulmonary edema  - responded to IV lasix, stop today due to over diuresis, monitor BMP.  3. Tobacco abuse. Patient continues to smoke about 2-3 cigarettes per day. Smoking cessation advised more than 4 minutes spent.  4. DVT prophylaxis subcutaneous Lovenox.  PT suggested SNF, but pt wish to go home once feel little more stronger.  All the records are reviewed and case discussed with Care Management/Social Workerr. Management plans discussed with the patient, family and they are in agreement.  CODE STATUS: DNR  TOTAL TIME TAKING CARE OF THIS PATIENT: 35 minutes.    POSSIBLE D/C IN 1-2 DAYS, DEPENDING ON CLINICAL CONDITION.   [name], Lakena Sparlin M.D on 05/27/2017   Between 7am to 6pm - Pager - [phone]  After 6pm go to [email or web address] - Social research officer, governmentpassword EPAS [facility]  [facility] [place] [facility]  Office  [phone]  CC: Primary care physician; Pricilla HolmSharpe, Leslie M, MD  Note: This dictation was prepared with Dragon dictation along with smaller phrase technology. Any transcriptional errors that result from this process are unintentional.

## 2017-05-27 NOTE — Plan of Care (Signed)
Problem: Education: Goal: Knowledge of [facility] Education information/materials will improve Outcome: Progressing Patient admitted hypoxia and ARF due to COPD and fluid overload. Plan of care includes continuous cardiopulmonary monitoring, diuretics with PO lasix, and all other medication conditions will be monitored while in the hospital.   Problem: Safety: Goal: Ability to remain free from injury will improve Outcome: Progressing Patient will be free from injury and falls during shift. Call bell in reach, yellow socks in place, bed alarm on, bed in lowest position. Patient educated to call for assistance.  Problem: Skin Integrity: Goal: Risk for impaired skin integrity will decrease Patient skin will remain clean dry and intact. No new skin break down during the shift  Problem: Fluid Volume: Goal: Ability to maintain a balanced intake and output will improve Outcome: Progressing Pt will remain hemodynamically stable

## 2017-05-28 ENCOUNTER — Inpatient Hospital Stay: Payer: Medicare Other

## 2017-05-28 LAB — CBC
HEMATOCRIT: 34.7 % — AB (ref 35.0–47.0)
Hemoglobin: 11.6 g/dL — ABNORMAL LOW (ref 12.0–16.0)
MCH: 31.6 pg (ref 26.0–34.0)
MCHC: 33.6 g/dL (ref 32.0–36.0)
MCV: 93.9 fL (ref 80.0–100.0)
PLATELETS: 302 10*3/uL (ref 150–440)
RBC: 3.69 MIL/uL — ABNORMAL LOW (ref 3.80–5.20)
RDW: 13.5 % (ref 11.5–14.5)
WBC: 6.9 10*3/uL (ref 3.6–11.0)

## 2017-05-28 LAB — BASIC METABOLIC PANEL
Anion gap: 7 (ref 5–15)
BUN: 37 mg/dL — AB (ref 6–20)
CHLORIDE: 96 mmol/L — AB (ref 101–111)
CO2: 33 mmol/L — AB (ref 22–32)
CREATININE: 0.74 mg/dL (ref 0.44–1.00)
Calcium: 8.8 mg/dL — ABNORMAL LOW (ref 8.9–10.3)
GFR calc Af Amer: >60 mL/min (ref >60)
GFR calc non Af Amer: >60 mL/min (ref >60)
GLUCOSE: 120 mg/dL — AB (ref 65–99)
POTASSIUM: 4.5 mmol/L (ref 3.5–5.1)
SODIUM: 136 mmol/L (ref 135–145)

## 2017-05-28 LAB — GLUCOSE, CAPILLARY
Glucose-Capillary: 213 mg/dL — ABNORMAL HIGH (ref 65–99)
Glucose-Capillary: 157 mg/dL — ABNORMAL HIGH (ref 65–99)

## 2017-05-28 MED ORDER — DEXTROSE 5 % IV SOLN
1.0000 g | INTRAVENOUS | Status: DC
  Administered 2017-05-28 – 2017-05-30 (×3): 1 g via INTRAVENOUS
  Filled 2017-05-28 (×4): qty 10

## 2017-05-28 MED ORDER — METOPROLOL TARTRATE 5 MG/5ML IV SOLN
5.0000 mg | Freq: Once | INTRAVENOUS | Status: AC
  Administered 2017-05-28: 5 mg via INTRAVENOUS

## 2017-05-28 MED ORDER — CARVEDILOL 6.25 MG PO TABS
6.2500 mg | ORAL_TABLET | Freq: Two times a day (BID) | ORAL | Status: DC
  Administered 2017-05-29: 6.25 mg via ORAL
  Filled 2017-05-28: qty 1

## 2017-05-28 MED ORDER — FUROSEMIDE 10 MG/ML IJ SOLN
INTRAMUSCULAR | Status: AC
  Administered 2017-05-28: 20 mg via INTRAVENOUS
  Filled 2017-05-28: qty 2

## 2017-05-28 MED ORDER — METOPROLOL TARTRATE 5 MG/5ML IV SOLN
INTRAVENOUS | Status: AC
  Administered 2017-05-28: 5 mg via INTRAVENOUS
  Filled 2017-05-28: qty 5

## 2017-05-28 MED ORDER — FUROSEMIDE 10 MG/ML IJ SOLN
20.0000 mg | Freq: Once | INTRAMUSCULAR | Status: AC
  Administered 2017-05-28: 20 mg via INTRAVENOUS

## 2017-05-28 NOTE — Progress Notes (Signed)
Plan of care discussed with patient. Patient and daughter discussed [facility] facility for discharge disposition.

## 2017-05-28 NOTE — Progress Notes (Signed)
Pt is in no distress. Bipap not needed at this time.

## 2017-05-28 NOTE — Progress Notes (Signed)
CH responded to an RR in room 240. Pt was sitting in the bed and being attended to by the medical team. Pt stated several times that she is feeling better. Medical team assessed she is stable and will keep her in the current room. CH provided silent prayer.    05/28/17 1700  Clinical Encounter Type  Visited With Patient;Health care provider  Visit Type Initial;Code (Rapid Response)  Referral From Nurse  Consult/Referral To Chaplain  Spiritual Encounters  Spiritual Needs Prayer

## 2017-05-28 NOTE — Progress Notes (Deleted)
Cardiology Office Note  Date:  05/28/2017   ID:  Paige Kelley, DOB 02-15-1951, MRN [id number]  PCP:  Pricilla HolmSharpe, Leslie M, MD   No chief complaint on file.   HPI:  66 year old woman with prior history of  systolic CHF  heavy alcohol abuse,  medication noncompliance,  recent[place] the area living with family,  recent hospitalization for COPD exacerbation in May 2018,  readmitted with shortness of breath ejection fraction 20-25% global hypokinesis   Reports that she does not drink frame much, though family outside the room reports that she underestimates her volume.  Does not take her Lasix as she has accidents, does not like to walk to the other into the house to urinate. She is unclear which medication she is taking Continues to smoke, does not seem to have any desire to quit  Discussed echocardiogram findings with her, ejection fraction 20-25% global hypokinesis She is uncertain if this is a new or old finding. Did not see urology in [place], only primary care Family reports some confusion at home Now with worsening leg swelling, cough over the past week or 2  In the emergency room noted to have hypoxia lower 80s, started on Bipap,  Sodium 112 Given several doses of IV Lasix, IV saline   PMH:   has a past medical history of Alcohol use; Chronic systolic CHF (congestive heart failure) (HCC); and Hypertension.  PSH:    Past Surgical History:  Procedure Laterality Date  . NO PAST SURGERIES      No current facility-administered medications for this visit.    No current outpatient prescriptions on file.   Facility-Administered Medications Ordered in Other Visits  Medication Dose Route Frequency Provider Last Rate Last Dose  . acetaminophen (TYLENOL) tablet 650 mg  650 mg Oral Q6H PRN Enedina FinnerPatel, Sona, MD   650 mg at 05/27/17 1643   Or  . acetaminophen (TYLENOL) suppository 650 mg  650 mg Rectal Q6H PRN Enedina FinnerPatel, Sona, MD      . budesonide (PULMICORT)  nebulizer solution 0.25 mg  0.25 mg Nebulization BID Eugenie NorrieBlakeney, Dana G, NP   0.25 mg at 05/28/17 0825  . carvedilol (COREG) tablet 6.25 mg  6.25 mg Oral BID WC [name], Vaibhavkumar, MD      . cefTRIAXone (ROCEPHIN) 1 g in dextrose 5 % 50 mL IVPB  1 g Intravenous Q24H [name], Vaibhavkumar, MD      . chlorhexidine (PERIDEX) 0.12 % solution 15 mL  15 mL Mouth Rinse BID Eugenie NorrieBlakeney, Dana G, NP   15 mL at 05/27/17 2212  . enoxaparin (LOVENOX) injection 40 mg  40 mg Subcutaneous Q24H Enedina FinnerPatel, Sona, MD      . feeding supplement (ENSURE ENLIVE) (ENSURE ENLIVE) liquid 237 mL  237 mL Oral BID BM [name], Vaibhavkumar, MD   237 mL at 05/27/17 1840  . guaiFENesin (MUCINEX) 12 hr tablet 600 mg  600 mg Oral BID Enedina FinnerPatel, Sona, MD   600 mg at 05/28/17 1014  . ipratropium-albuterol (DUONEB) 0.5-2.5 (3) MG/3ML nebulizer solution 3 mL  3 mL Nebulization Q6H PRN Eugenie NorrieBlakeney, Dana G, NP      . ipratropium-albuterol (DUONEB) 0.5-2.5 (3) MG/3ML nebulizer solution 3 mL  3 mL Nebulization TID Eugenie NorrieBlakeney, Dana G, NP   3 mL at 05/28/17 0825  . MEDLINE mouth rinse  15 mL Mouth Rinse q12n4p Eugenie NorrieBlakeney, Dana G, NP   15 mL at 05/26/17 1822  . methylPREDNISolone sodium succinate (SOLU-MEDROL) 40 mg/mL injection 40 mg  40 mg Intravenous  Q12H Eugenie Norrie, NP   40 mg at 05/27/17 2212  . multivitamin with minerals tablet 1 tablet  1 tablet Oral Daily Altamese Dilling, MD   1 tablet at 05/28/17 1014  . ondansetron (ZOFRAN) tablet 4 mg  4 mg Oral Q6H PRN Enedina Finner, MD       Or  . ondansetron [facility]) injection 4 mg  4 mg Intravenous Q6H PRN Enedina Finner, MD      . polyethylene glycol (MIRALAX / GLYCOLAX) packet 17 g  17 g Oral Daily PRN Enedina Finner, MD      . senna (SENOKOT) tablet 8.6 mg  1 tablet Oral BID Enedina Finner, MD   8.6 mg at 05/28/17 1014  . traMADol (ULTRAM) tablet 50 mg  50 mg Oral Q6H PRN Enedina Finner, MD   50 mg at 05/28/17 1014     Allergies:   Patient has no known allergies.   Social History:  The patient  reports  that she has been smoking Cigarettes.  She has been smoking about 1.00 pack per day. She has never used smokeless tobacco. She reports that she drinks about 2.4 oz of alcohol per week . She reports that she does not use drugs.   Family History:   family history includes Hypertension in her other.    Review of Systems: ROS   PHYSICAL EXAM: VS:  There were no vitals taken for this visit. , BMI There is no height or weight on file to calculate BMI. GEN: Well nourished, well developed, in no acute distress HEENT: normal Neck: no JVD, carotid bruits, or masses Cardiac: RRR; no murmurs, rubs, or gallops,no edema  Respiratory:  clear to auscultation bilaterally, normal work of breathing GI: soft, nontender, nondistended, + BS MS: no deformity or atrophy Skin: warm and dry, no rash Neuro:  Strength and sensation are intact Psych: euthymic mood, full affect    Recent Labs: 04/30/2017: TSH 1.235 05/03/2017: ALT 41; Magnesium 1.9 05/25/2017: B Natriuretic Peptide 1,903.0 05/28/2017: BUN 37; Creatinine, Ser 0.74; Hemoglobin 11.6; Platelets 302; Potassium 4.5; Sodium 136    Lipid Panel No results found for: CHOL, HDL, LDLCALC, TRIG    Wt Readings from Last 3 Encounters:  05/25/17 134 lb 0.6 oz (60.8 kg)  05/01/17 129 lb 14.4 oz (58.9 kg)  03/10/17 132 lb 4.8 oz (60 kg)       ASSESSMENT AND PLAN:  No diagnosis found.  1)--- Acute on chronic systolic CHF Severely depressed ejection fraction, timing unclear Ejection fraction 20-25% Unclear if this is nonischemic or ischemic Heavy alcohol in the setting of long smoking history Some medication noncompliance will make this difficult to manage Started discussion today concerning workup if any, patient was unclear what she preferred Started discussion with family. Family at the bedside was unclear what patient's wishes were Per the discussion, does not sound that she has had much cardiac workup in [place] ---For now would continue IV  diuretics given systolic CHF, Low blood pressure will limit aggressive medical management Currently on carvedilol, could add low-dose ACE inhibitor  2)----- respiratory distress/COPD Continues to smoke, recent hospitalization for COPD exacerbation Likely contributing to hypoxia  3) hyponatremia Suspect secondary to long-standing alcohol in the setting of systolic CHF Improvement up to 126  4) alcohol abuse Cessation recommended Will need help from family  5)Dispo Discussed with family that outcome is poor Patient is not motivated, medication noncompliance, continued alcohol and smoking in the setting of ejection fraction 20%.  Started DNR/DNI discussion, patient  did not seem to want intubation and resuscitation If she continues to drink, medication noncompliance, would consider involving palliative care Gentle diuresis  Disposition:   F/U  6 months  No orders of the defined types were placed in this encounter.    Signed, Dossie Arbour, M.D., Ph.D. 05/28/2017  [facility] [place], [place] [phone]

## 2017-05-28 NOTE — Progress Notes (Signed)
Rapid Response called to room 240 for patient c/o increased SOB. Patient found sitting upright in bed, leaning forward with labored breathing. Primary nurse at bedside states patient developed increased SOB with crackles,tachycardia and elevated BP. RT at bedside. Physician had been paged to make aware of situation. Medication orders given. Nebulizer treatment given by RT. CXR ordered. ICU Shift Co. also at bedside. Metoprolol and lasix given by primary nurse. After meds, patient's breathing much improved. VSS. Patient stated felt much better.

## 2017-05-28 NOTE — Progress Notes (Signed)
[employer] - [place] at [facility]   PATIENT NAME: Paige Kelley    MR#:  [id number]  DATE OF BIRTH:  August 11, 1951  SUBJECTIVE:  CHIEF COMPLAINT:   Chief Complaint  Patient presents with  . Respiratory Distress     Came with hypoxia, initially required BIPAP in ICU, now improved and on 3-4 ltr nasal canula oxygen, transferred to floor.   Her complain of generalized weakness and feels more fatigued. Found to have UTI.   He was initially suggested to go to rehabilitation, but was denying, now agrees to go to rehabilitation if required.  REVIEW OF SYSTEMS:  CONSTITUTIONAL: No fever, fatigue or weakness.  EYES: No blurred or double vision.  EARS, NOSE, AND THROAT: No tinnitus or ear pain.  RESPIRATORY: No cough,positive for shortness of breath, wheezing , no hemoptysis.  CARDIOVASCULAR: No chest pain, orthopnea, edema.  GASTROINTESTINAL: No nausea, vomiting, diarrhea or abdominal pain.  GENITOURINARY: No dysuria, hematuria.  ENDOCRINE: No polyuria, nocturia,  HEMATOLOGY: No anemia, easy bruising or bleeding SKIN: No rash or lesion. MUSCULOSKELETAL: No joint pain or arthritis.   NEUROLOGIC: No tingling, numbness, weakness.  PSYCHIATRY: No anxiety or depression.   ROS  DRUG ALLERGIES:  No Known Allergies  VITALS:  Blood pressure (!) 143/92, pulse (!) 102, temperature (!) 97.5 F (36.4 C), temperature source Oral, resp. rate 16, height 5\' 7"  (1.702 m), weight 60.8 kg (134 lb 0.6 oz), SpO2 98 %.  PHYSICAL EXAMINATION:  GENERAL:  66 y.o.-year-old patient lying in the bed with no acute distress.  EYES: Pupils equal, round, reactive to light and accommodation. No scleral icterus. Extraocular muscles intact.  HEENT: Head atraumatic, normocephalic. Oropharynx and nasopharynx clear.  NECK:  Supple, no jugular venous distention. No thyroid enlargement, no tenderness.  LUNGS: Normal breath sounds bilaterally, have no wheezing, no crepitation. No use of accessory muscles  of respiration.  CARDIOVASCULAR: S1, S2 normal. No murmurs, rubs, or gallops.  ABDOMEN: Soft, nontender, nondistended. Bowel sounds present. No organomegaly or mass.  EXTREMITIES: No pedal edema, cyanosis, or clubbing.  NEUROLOGIC: Cranial nerves II through XII are intact. Muscle strength 4/5 in all extremities. Sensation intact. Gait not checked.  PSYCHIATRIC: The patient is alert and oriented x 3.  SKIN: No obvious rash, lesion, or ulcer.   Physical Exam LABORATORY PANEL:   CBC  Recent Labs Lab 05/28/17 0442  WBC 6.9  HGB 11.6*  HCT 34.7*  PLT 302   ------------------------------------------------------------------------------------------------------------------  Chemistries   Recent Labs Lab 05/28/17 0442  NA 136  K 4.5  CL 96*  CO2 33*  GLUCOSE 120*  BUN 37*  CREATININE 0.74  CALCIUM 8.8*   ------------------------------------------------------------------------------------------------------------------  Cardiac Enzymes  Recent Labs Lab 05/25/17 0906  TROPONINI 0.04*   ------------------------------------------------------------------------------------------------------------------  RADIOLOGY:  No results found.  ASSESSMENT AND PLAN:   Active Problems:   Acute on chronic respiratory failure (HCC)   Protein-calorie malnutrition, severe   Dezire Turk  is a 66 y.o. female with a known history of Alcohol abuse, chronic systolic congestive heart failure. 25%, COPD with ongoing tobacco abuse , chronic home oxygen comes to the emergency room with increasing shortness of breath. Patient appears very tight and unable to move much air.   * Acute on chronic hypoxic respiratory failure secondary to COPD/emphysema exacerbation and acute on chronic systolic congestive heart failure. - IV Solu-Medrol, nebulizer, BiPAP, inhalers - IV Lasix 20 mg twice a day- now stop today as pt have fatigue, likely over diuresis. - Pulmonary consultation appreciated with  Dr.  Belia Hemankasa - have good diuresis, Improved now., on nasal canula. ( Baseline.)   * Acute on chronic congestive heart failure systolic -Elevated BNP -Chest x-ray does not show any pulmonary edema  - responded to IV lasix, stop today due to over diuresis, monitor BMP.  * UTI   IV rocephin. Follow ur cx  * Tobacco abuse. Patient continues to smoke about 2-3 cigarettes per day. Smoking cessation advised more than 4 minutes spent.  * DVT prophylaxis subcutaneous Lovenox.  PT suggested SNF, now patient agreed to go, as evaluation was 3 days ago- I requested to have a reevaluation by PT.  All the records are reviewed and case discussed with Care Management/Social Workerr. Management plans discussed with the patient, family and they are in agreement.  CODE STATUS: DNR  TOTAL TIME TAKING CARE OF THIS PATIENT: 35 minutes.    POSSIBLE D/C IN 1-2 DAYS, DEPENDING ON CLINICAL CONDITION.   [name], Ollen Rao M.D on 05/28/2017   Between 7am to 6pm - Pager - [phone]  After 6pm go to [email or web address] - Social research officer, governmentpassword EPAS [facility]  [facility] [place] [facility]  Office  [phone]  CC: Primary care physician; Pricilla HolmSharpe, Leslie M, MD  Note: This dictation was prepared with Dragon dictation along with smaller phrase technology. Any transcriptional errors that result from this process are unintentional.

## 2017-05-28 NOTE — Progress Notes (Addendum)
1655 Patient c/o increased SOB, crackles heard in the bases. Pounding noise noted to left lower lobe.  MD paged. Patient AAOx3.  1705 Patient on bedside commode having a bowel movement. Pt diaphoretic, clammy. pt stating " I can't breathe and feel nauseous. ", HR in 140's. Assistance called to room. BP elevated 179/108, HR 141.   1706 MD paged and Rapid Response called to room. Pt assisted to bed.  1708 Orders received by Dr. Elisabeth PigeonVachhani 5mg  metoprolo IV pushl and 20mg  IV lasix. Zofran given, see MAR.    1710 Rapid Response and RT at bedside.  1730 HR 98, O2 sats 100% on high flow. Patient states " I can breathe better and feel better now. Awaiting for CXR.

## 2017-05-29 ENCOUNTER — Encounter: Payer: Self-pay | Admitting: Nurse Practitioner

## 2017-05-29 DIAGNOSIS — I5023 Acute on chronic systolic (congestive) heart failure: Secondary | ICD-10-CM

## 2017-05-29 MED ORDER — FUROSEMIDE 40 MG PO TABS
40.0000 mg | ORAL_TABLET | Freq: Every day | ORAL | Status: DC
  Administered 2017-05-30: 40 mg via ORAL
  Filled 2017-05-29: qty 1

## 2017-05-29 MED ORDER — CARVEDILOL 12.5 MG PO TABS
12.5000 mg | ORAL_TABLET | Freq: Two times a day (BID) | ORAL | Status: DC
  Administered 2017-05-29 – 2017-05-30 (×3): 12.5 mg via ORAL
  Filled 2017-05-29 (×3): qty 1

## 2017-05-29 MED ORDER — SPIRONOLACTONE 25 MG PO TABS
25.0000 mg | ORAL_TABLET | Freq: Every day | ORAL | Status: DC
  Administered 2017-05-29 – 2017-05-30 (×2): 25 mg via ORAL
  Filled 2017-05-29 (×2): qty 1

## 2017-05-29 MED ORDER — FUROSEMIDE 10 MG/ML IJ SOLN
40.0000 mg | Freq: Once | INTRAMUSCULAR | Status: AC
  Administered 2017-05-29: 40 mg via INTRAVENOUS
  Filled 2017-05-29: qty 4

## 2017-05-29 MED ORDER — LOSARTAN POTASSIUM 25 MG PO TABS
25.0000 mg | ORAL_TABLET | Freq: Every day | ORAL | Status: DC
  Administered 2017-05-30: 25 mg via ORAL
  Filled 2017-05-29: qty 1

## 2017-05-29 MED ORDER — LISINOPRIL 5 MG PO TABS
2.5000 mg | ORAL_TABLET | Freq: Every day | ORAL | Status: DC
  Administered 2017-05-29: 2.5 mg via ORAL
  Filled 2017-05-29: qty 1

## 2017-05-29 MED ORDER — FUROSEMIDE 20 MG PO TABS: 20.0000 mg | ORAL_TABLET | Freq: Two times a day (BID) | ORAL | Status: DC

## 2017-05-29 NOTE — Consult Note (Signed)
Cardiology Consult    Patient ID: Paige Kelley MRN: [id number], DOB/AGE: [birth year]   Admit date: 05/25/2017 Date of Consult: 05/29/2017  Primary Physician: Pricilla Holm, MD Primary Cardiologist: Concha Se, MD  Requesting Provider: Ozella Rocks, MD  Patient Profile    Paige Kelley is a 66 y.o. female with a history of ETOH abuse, Tob Abuse, cardiomyopathy, HFrEF, COPD, and noncompliance, who is being seen today for the evaluation of dyspnea at the request of Dr. Elisabeth Pigeon.  Past Medical History   Past Medical History:  Diagnosis Date  . Alcohol use   . Chronic systolic CHF (congestive heart failure) (HCC)    a. TTE 04/2017: EF 20-25%, unable to exclude RWMA, GR2DD, mild AI, severe MR, mild biatrial enlargement, mod-sev TR, moderately elevated PASP  . Hypertension   . Noncompliance   . Tobacco abuse     Past Surgical History:  Procedure Laterality Date  . NO PAST SURGERIES       Allergies  No Known Allergies  History of Present Illness    66 y/o ? with the above complex PMH including HFrEF, COPD, tob abuse, etoh abuse, and noncompliance.  She was dx with CHF in 2010 in [place].  She does not recall having an extensive cardiac w/u @ the time.  She did not routinely f/u and was not compliant w/ outpt meds.  She cont to drink and smoke cigarettes.  She now lives in [place] w/ her dtr.  She was recently admitted to [facility] in July with increasing dyspnea and productive cough.  She was treated for COPD/chf/resp failure.  Echo showed EF of 20-25% with Gr2 DD along with mod-sev MR/TR.  She was seen by our team with recommendation for medical therapy, close outpt f/u, and possibly a stress test at some point in the future.  F/u was arranged for the CHF clinic for 7/31 but she failed to show for it.  She is also scheduled to f/u with Dr. Mariah Milling on 8/14.  Since her d/c in July, she reports compliance with meds.  She says that she has not been drinking but has continued to smoke up  to 1/2 ppd.  She is sedentary and had not noted any significant dyspnea until the evening prior to admission, when she noted increasing wheezing with dyspnea and orthopnea.  Symptoms persisted overnight, prompting her to present to [facility] ED early on 8/9.  Upon arrival, she was hypoxic with sats in the 60's to 70's.  CXR showed NAD.  She was treated with BiPAP, IV solumedrol, and IV lasix and admitted for further eval.  Trop was mildly elevated @ 0.04, while BNP was 1903 on admission.  Since admission, she has remained on inhalers/nebs/steroids.  She was initially treated with PO lasix but on the evening of 8/12, she became acutely dyspneic while using the commode with elevation in BP to 179/108.  Sinus tachycardia to 140's was also noted.  She was treated with IV lasix and IV metprolol, and placed on high flow O2 with eventual improvement in Ss.  F/u cxr did show pulm edema.  She is net negative 3.3L since admission.  Her breathing is stable this AM.  She says that she doesn't think she was taking this as seriously as she should have been and is now willing to commit to her care.  She denies any recent chest pain.  Inpatient Medications    . budesonide (PULMICORT) nebulizer solution  0.25 mg Nebulization BID  . carvedilol  6.25 mg Oral BID WC  . chlorhexidine  15 mL Mouth Rinse BID  . enoxaparin (LOVENOX) injection  40 mg Subcutaneous Q24H  . feeding supplement (ENSURE ENLIVE)  237 mL Oral BID BM  . furosemide  20 mg Oral BID  . guaiFENesin  600 mg Oral BID  . ipratropium-albuterol  3 mL Nebulization TID  . lisinopril  2.5 mg Oral Daily  . mouth rinse  15 mL Mouth Rinse q12n4p  . methylPREDNISolone (SOLU-MEDROL) injection  40 mg Intravenous Q12H  . multivitamin with minerals  1 tablet Oral Daily  . senna  1 tablet Oral BID  . spironolactone  25 mg Oral Daily    Family History    Family History  Problem Relation Age of Onset  . Hypertension Other     Social History    Social History    Social History  . Marital status: Widowed    Spouse name: N/A  . Number of children: N/A  . Years of education: N/A   Occupational History  . Not on file.   Social History Main Topics  . Smoking status: Current Every Day Smoker    Packs/day: 1.00    Types: Cigarettes  . Smokeless tobacco: Never Used  . Alcohol use 2.4 oz/week    4 Cans of beer per week  . Drug use: No  . Sexual activity: Not on file   Other Topics Concern  . Not on file   Social History Narrative  . No narrative on file     Review of Systems    General:  No chills, fever, night sweats or weight changes.  Cardiovascular:  No chest pain, +++ dyspnea on exertion, no edema, +++ orthopnea, no palpitations, paroxysmal nocturnal dyspnea. Dermatological: No rash, lesions/masses Respiratory: +++ cough, +++ dyspnea Urologic: No hematuria, dysuria Abdominal:   No nausea, vomiting, diarrhea, bright red blood per rectum, melena, or hematemesis Neurologic:  No visual changes, wkns, changes in mental status. All other systems reviewed and are otherwise negative except as noted above.  Physical Exam    Blood pressure (!) 153/92, pulse 99, temperature 98.4 F (36.9 C), temperature source Oral, resp. rate 18, height 5\' 7"  (1.702 m), weight 134 lb 0.6 oz (60.8 kg), SpO2 (!) 89 %.  General: Pleasant, NAD Psych: Normal affect. Neuro: Alert and oriented X 3. Moves all extremities spontaneously. HEENT: Normal  Neck: Supple without bruits or JVD. Lungs:  Resp regular and unlabored, markedly diminished breath sounds with bibasilar crackles. Heart: RRR, distant, 2/6 syst murmur @ llsb  apex.  No s3, s4. Abdomen: Soft, non-tender, non-distended, BS + x 4.  Extremities: No clubbing, cyanosis or edema. DP/PT/Radials 1+ and equal bilaterally.  Labs    Lab Results  Component Value Date   WBC 6.9 05/28/2017   HGB 11.6 (L) 05/28/2017   HCT 34.7 (L) 05/28/2017   MCV 93.9 05/28/2017   PLT 302 05/28/2017    Recent  Labs Lab 05/28/17 0442  NA 136  K 4.5  CL 96*  CO2 33*  BUN 37*  CREATININE 0.74  CALCIUM 8.8*  GLUCOSE 120*    Radiology Studies    Dg Chest 1 View  Result Date: 05/25/2017 CLINICAL DATA:  Dyspnea EXAM: CHEST 1 VIEW COMPARISON:  04/30/2017 chest radiograph. FINDINGS: Right rotated chest radiograph. Stable cardiomediastinal silhouette with top-normal heart size. No pneumothorax. No pleural effusion. Lungs appear clear, with no acute consolidative airspace disease and no pulmonary edema. IMPRESSION: No active disease. Electronically Signed   By:  Delbert Phenix M.D.   On: 05/25/2017 09:43   Ct Angio Chest Pe W Or Wo Contrast  Result Date: 05/03/2017 CLINICAL DATA:  History of COPD with tobacco abuse. History of congestive heart failure. Worsening shortness of breath and hypoxia. EXAM: CT ANGIOGRAPHY CHEST WITH CONTRAST TECHNIQUE: Multidetector CT imaging of the chest was performed using the standard protocol during bolus administration of intravenous contrast. Multiplanar CT image reconstructions and MIPs were obtained to evaluate the vascular anatomy. CONTRAST:  75 cc Isovue 370 COMPARISON:  Chest radiography 04/30/2017.  Chest CT 03/10/2017. FINDINGS: Cardiovascular: Mild cardiomegaly. No pericardial fluid. Pulmonary arterial opacification is good. No pulmonary emboli. Aortic atherosclerosis. Coronary artery calcification. Mediastinum/Nodes: No mass or lymphadenopathy. Lungs/Pleura: Background pattern of advanced emphysema without dominant bullous disease. No sign of infiltrate. Mild basilar atelectasis and small bilateral effusions. Upper Abdomen: Negative Musculoskeletal: Negative appearance of the spine. Newly seen healing rib fracture of the left eleventh rib posteriorly, not present on the previous exam. Review of the MIP images confirms the above findings. IMPRESSION: No pulmonary emboli. Mild basilar atelectasis and tiny pleural effusions. Healing left eleventh rib fracture, not present on  03/10/2017 Aortic Atherosclerosis (ICD10-I70.0) and Emphysema (ICD10-J43.9). Electronically Signed   By: Paulina Fusi M.D.   On: 05/03/2017 11:52   Dg Chest Port 1 View  Result Date: 05/28/2017 CLINICAL DATA:  Dyspnea.  Smoker. EXAM: PORTABLE CHEST 1 VIEW COMPARISON:  05/25/2017. FINDINGS: Stable enlarged cardiac silhouette. Mild increase in prominence of the pulmonary vasculature and interstitial markings with Kerley lines. The lungs remain hyperexpanded. Old, healed bilateral rib fractures. Diffuse osteopenia. IMPRESSION: Interval mild changes of congestive heart failure superimposed on COPD with stable cardiomegaly. Electronically Signed   By: Beckie Salts M.D.   On: 05/28/2017 18:05   Dg Chest Port 1 View  Result Date: 04/30/2017 CLINICAL DATA:  COPD EXAM: PORTABLE CHEST 1 VIEW COMPARISON:  03/10/2017 FINDINGS: Cardiac shadow is enlarged. It is somewhat accentuated by the portable technique. The lungs are hyperinflated without focal infiltrate or sizable effusion. No acute bony abnormality is seen. IMPRESSION: COPD without acute abnormality. Electronically Signed   By: Alcide Clever M.D.   On: 04/30/2017 18:05   US Abdomen Limited Ruq  Result Date: 05/01/2017 CLINICAL DATA:  66 year old female with abdominal pain. EXAM: ULTRASOUND ABDOMEN LIMITED RIGHT UPPER QUADRANT COMPARISON:  Chest CTA 03/10/2017. FINDINGS: Gallbladder: Contracted. Mild generalized gallbladder wall thickening (3-4 mm). Several nonshadowing echogenic foci in the fundus measuring about 4 mm diameter might be small stones or polyps (image 9). No pericholecystic fluid. No sonographic Murphy sign elicited. No shadowing stones. Common bile duct: Diameter: 3 mm , normal Liver: No focal lesion identified. Within normal limits in parenchymal echogenicity. Other findings: Negative visible right kidney. IMPRESSION: 1. Partially contracted gallbladder with several subcentimeter nonshadowing stones versus fundal polyps (the latter would be  inconsequential). No strong evidence of acute cholecystitis. 2. No evidence of biliary obstruction. Normal ultrasound appearance of the liver. Electronically Signed   By: Odessa Fleming M.D.   On: 05/01/2017 08:09    ECG & Cardiac Imaging    Sinus tachycardia, 114, pac's, pvc, BAE, IVCD w/ poor r progression - ? Prior ant infarct - no acute changes.  Assessment & Plan    1.  Acute on chronic multifactorial respiratory failure:  In setting of AECOPD and HFrEF.  Inhalers/nebs/steroids per IM.  Cont current cardiac meds as outlined below.  2.  Acute on chronic systolic CHF/cardiomyopathy (? ICM vs NICM):  Pt with reported h/o  CHF dating back to 2010.  Unclear if she has ever had formal ischemic eval.  EF 20-25% with mod-sev TR/MR on echo in July.  She was placed on  blocker & lasix @ that time and she missed her CHF f/u appt.  Readmitted with resp failure and AECOPD.  Developed pulm edema last night in the setting of marked HTN.  She says she is now committed to managing her heart failure and COPD better.  She responded well to IV lasix last night.  Still with crackles on exam.  Still orthopneic.  No weight since 8/9.  Minus 3.3L since admission.  Currently on lasix 20 mg PO BID.  Spironolactone and low dose ACEI added today.  I will give a dose of lasix 40 IV x 1 now and plan to start lasix 40 po daily tomorrow.  Will switch ACEI to ARB given COPD hx and also to start moving her toward entresto.  Cont coreg - titrate given ongoing elevated bp's.  If she develops wheezing on coreg, would switch to bisoprolol.  As previously noted, she will need to f/u as outpt.  At that point, we can consider outpt ischemic evaluation.  I stressed the importance of ongoing tobacco and etoh avoidance.  3.  AECOPD:  Not wheezing today.  Inhalers/nebs/steroids per IM.  4.  UTI:  abx per IM.  5.  Tob/ETOH abuse:  She has continued to smoke but says that she has not had a drink since her admission in July.  Complete cessation of  both advised.  6.  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  Conservative Rx for now.  Will need to f/u echo to re-eval once HF therapy maximized.  Signed, Nicolasa Ducking, NP 05/29/2017, 12:36 PM

## 2017-05-29 NOTE — Care Management (Signed)
Patient had rapid response called within last 24 hours due to respiratory difficulty and elevated heart rate.  She according to documentation in RR note required high flow nasal cannula for a short while but now on nasal cannula.  Patient is now agreeable to pursue facility placement prior to going home.  Says her daughter Paige Kelley is feeling stressed about having to manage mother's care and patient says that she now knows she should not have declined home health visits. Since there had been a previous transition care hospital referral  will ask for her to be reviewed for admission criteria.  Patient at present time is agreeable to facility placement.  Asked that CM call her brother Paige Kelley just to make sure he is also in agreement, which he is.

## 2017-05-29 NOTE — Plan of Care (Signed)
Problem: Bowel/Gastric: Goal: Will not experience complications related to bowel motility Outcome: Not Progressing Patient is fearful to have a bowel movement due to tachycardic episode on 8/12

## 2017-05-29 NOTE — Progress Notes (Signed)
[facility] [place] [facility]   PATIENT NAME: Paige Kelley    MR#:  [id number]  DATE OF BIRTH:  1951-05-18  SUBJECTIVE:  CHIEF COMPLAINT:   Chief Complaint  Patient presents with  . Respiratory Distress     Came with hypoxia, initially required BIPAP in ICU, now improved and on 3-4 ltr nasal canula oxygen, transferred to floor.   Her complain of generalized weakness and feels more fatigued. Found to have UTI.   She was initially suggested to go to rehabilitation, but was denying, now agrees to go to rehabilitation if required.  She had RAPID RESPONSE on 05/28/17 evening due to SOB and tachycardia, found to have pulm edema on Xray chest- given inj metoprolol and Lasix and responded well. She was on Venti mask, but now on nasal canula 5 ltr.  REVIEW OF SYSTEMS:  CONSTITUTIONAL: No fever,positive for fatigue or weakness.  EYES: No blurred or double vision.  EARS, NOSE, AND THROAT: No tinnitus or ear pain.  RESPIRATORY: No cough,positive for shortness of breath, wheezing , no hemoptysis.  CARDIOVASCULAR: No chest pain, orthopnea, edema.  GASTROINTESTINAL: No nausea, vomiting, diarrhea or abdominal pain.  GENITOURINARY: No dysuria, hematuria.  ENDOCRINE: No polyuria, nocturia,  HEMATOLOGY: No anemia, easy bruising or bleeding SKIN: No rash or lesion. MUSCULOSKELETAL: No joint pain or arthritis.   NEUROLOGIC: No tingling, numbness, weakness.  PSYCHIATRY: No anxiety or depression.   ROS  DRUG ALLERGIES:  No Known Allergies  VITALS:  Blood pressure (!) 153/92, pulse 99, temperature 98.4 F (36.9 C), temperature source Oral, resp. rate 18, height 5\' 7"  (1.702 m), weight 60.8 kg (134 lb 0.6 oz), SpO2 (!) 89 %.  PHYSICAL EXAMINATION:  GENERAL:  66 y.o.-year-old patient lying in the bed with no acute distress.  EYES: Pupils equal, round, reactive to light and accommodation. No scleral icterus. Extraocular muscles intact.  HEENT: Head atraumatic, normocephalic.  Oropharynx and nasopharynx clear.  NECK:  Supple, no jugular venous distention. No thyroid enlargement, no tenderness.  LUNGS: Normal breath sounds bilaterally, have no wheezing, bilateral crepitation. No use of accessory muscles of respiration.  CARDIOVASCULAR: S1, S2 normal. No murmurs, rubs, or gallops.  ABDOMEN: Soft, nontender, nondistended. Bowel sounds present. No organomegaly or mass.  EXTREMITIES: No pedal edema, cyanosis, or clubbing.  NEUROLOGIC: Cranial nerves II through XII are intact. Muscle strength 4/5 in all extremities. Sensation intact. Gait not checked.  PSYCHIATRIC: The patient is alert and oriented x 3.  SKIN: No obvious rash, lesion, or ulcer.   Physical Exam LABORATORY PANEL:   CBC  Recent Labs Lab 05/28/17 0442  WBC 6.9  HGB 11.6*  HCT 34.7*  PLT 302   ------------------------------------------------------------------------------------------------------------------  Chemistries   Recent Labs Lab 05/28/17 0442  NA 136  K 4.5  CL 96*  CO2 33*  GLUCOSE 120*  BUN 37*  CREATININE 0.74  CALCIUM 8.8*   ------------------------------------------------------------------------------------------------------------------  Cardiac Enzymes  Recent Labs Lab 05/25/17 0906  TROPONINI 0.04*   ------------------------------------------------------------------------------------------------------------------  RADIOLOGY:  Dg Chest Port 1 View  Result Date: 05/28/2017 CLINICAL DATA:  Dyspnea.  Smoker. EXAM: PORTABLE CHEST 1 VIEW COMPARISON:  05/25/2017. FINDINGS: Stable enlarged cardiac silhouette. Mild increase in prominence of the pulmonary vasculature and interstitial markings with Kerley lines. The lungs remain hyperexpanded. Old, healed bilateral rib fractures. Diffuse osteopenia. IMPRESSION: Interval mild changes of congestive heart failure superimposed on COPD with stable cardiomegaly. Electronically Signed   By: Beckie Salts M.D.   On: 05/28/2017 18:05  ASSESSMENT AND PLAN:   Active Problems:   Acute on chronic respiratory failure (HCC)   Protein-calorie malnutrition, severe   Paige Kelley  is a 66 y.o. female with a known history of Alcohol abuse, chronic systolic congestive heart failure. 25%, COPD with ongoing tobacco abuse , chronic home oxygen comes to the emergency room with increasing shortness of breath. Patient appears very tight and unable to move much air.   * Acute on chronic hypoxic respiratory failure secondary to COPD/emphysema exacerbation and acute on chronic systolic congestive heart failure. - IV Solu-Medrol, nebulizer, BiPAP, inhalers - IV Lasix 20 mg twice a day-given initially and held later as pt had fatigue, likely over diuresis. - Pulmonary consultation appreciated with Dr. Belia Hemankasa - was on nasal canula. ( Baseline.)  - now 05/29/17 again require high oxygen than baseline and have pulm edema, Restarted Lasix, added spironolactone and lisinopril.    Called cardiology consult.  * Acute on chronic congestive heart failure systolic- EF 25% -Elevated BNP -Chest x-ray does now show any pulmonary edema  - Oral lasix now, monitor. - cardiology consult. - Added spironolactone and low dose lisinopril.  * UTI   IV rocephin. Follow ur cx  * Tobacco abuse. Patient continues to smoke about 2-3 cigarettes per day. Smoking cessation advised more than 4 minutes spent.  * DVT prophylaxis subcutaneous Lovenox.  PT suggested SNF, now patient agreed to go, as evaluation was 3 days ago- I requested to have a reevaluation by PT.  All the records are reviewed and case discussed with Care Management/Social Workerr. Management plans discussed with the patient, family and they are in agreement.  CODE STATUS: DNR  TOTAL TIME TAKING CARE OF THIS PATIENT: 35 minutes.  No d/c today due to worsening in respi status again last night and needing higher than baseline oxygen.  cardio consult called in.  POSSIBLE D/C IN 1-2  DAYS, DEPENDING ON CLINICAL CONDITION.   Paige Kelley, Paige Kelley M.D on 05/29/2017   Between 7am to 6pm - Pager - [phone]  After 6pm go to [email or web address] - Social research officer, governmentpassword EPAS [email or web address]  [facility]  Office  [phone]  CC: Primary care physician; Pricilla HolmSharpe, Leslie M, MD  Note: This dictation was prepared with Dragon dictation along with smaller phrase technology. Any transcriptional errors that result from this process are unintentional.

## 2017-05-30 ENCOUNTER — Ambulatory Visit: Payer: Medicare Other | Admitting: Cardiovascular Disease

## 2017-05-30 ENCOUNTER — Ambulatory Visit (HOSPITAL_COMMUNITY)
Admission: AD | Admit: 2017-05-30 | Discharge: 2017-05-30 | Disposition: A | Discharge status: Home / Self Care | Payer: Medicare Other | Source: Other Acute Inpatient Hospital | Attending: Internal Medicine | Admitting: Internal Medicine

## 2017-05-30 DIAGNOSIS — I5021 Acute systolic (congestive) heart failure: Secondary | ICD-10-CM

## 2017-05-30 DIAGNOSIS — J449 Chronic obstructive pulmonary disease, unspecified: Secondary | ICD-10-CM | POA: Insufficient documentation

## 2017-05-30 DIAGNOSIS — J9622 Acute and chronic respiratory failure with hypercapnia: Secondary | ICD-10-CM

## 2017-05-30 LAB — BASIC METABOLIC PANEL
Anion gap: 11 (ref 5–15)
BUN: 40 mg/dL — AB (ref 6–20)
CALCIUM: 9.1 mg/dL (ref 8.9–10.3)
CO2: 33 mmol/L — AB (ref 22–32)
CREATININE: 0.85 mg/dL (ref 0.44–1.00)
Chloride: 90 mmol/L — ABNORMAL LOW (ref 101–111)
GFR calc Af Amer: >60 mL/min (ref >60)
GFR calc non Af Amer: >60 mL/min (ref >60)
GLUCOSE: 163 mg/dL — AB (ref 65–99)
Potassium: 4.4 mmol/L (ref 3.5–5.1)
Sodium: 134 mmol/L — ABNORMAL LOW (ref 135–145)

## 2017-05-30 MED ORDER — ADULT MULTIVITAMIN W/MINERALS CH: 1.0000 | ORAL_TABLET | Freq: Every day | ORAL | 0 refills | Status: AC

## 2017-05-30 MED ORDER — SENNA 8.6 MG PO TABS: 1.0000 | ORAL_TABLET | Freq: Two times a day (BID) | ORAL | 0 refills | Status: AC

## 2017-05-30 MED ORDER — IPRATROPIUM-ALBUTEROL 0.5-2.5 (3) MG/3ML IN SOLN: 3.0000 mL | Freq: Four times a day (QID) | RESPIRATORY_TRACT | 0 refills | Status: AC | PRN

## 2017-05-30 MED ORDER — PREDNISONE 10 MG (21) PO TBPK: ORAL_TABLET | ORAL | 0 refills | Status: AC

## 2017-05-30 MED ORDER — BUDESONIDE 0.25 MG/2ML IN SUSP: 0.2500 mg | Freq: Two times a day (BID) | RESPIRATORY_TRACT | 12 refills | Status: AC

## 2017-05-30 MED ORDER — ENSURE ENLIVE PO LIQD: 237.0000 mL | Freq: Two times a day (BID) | ORAL | 12 refills | Status: AC

## 2017-05-30 MED ORDER — LOSARTAN POTASSIUM 25 MG PO TABS: 25.0000 mg | ORAL_TABLET | Freq: Every day | ORAL | 0 refills | Status: AC

## 2017-05-30 MED ORDER — TRAMADOL HCL 50 MG PO TABS: 50.0000 mg | ORAL_TABLET | Freq: Four times a day (QID) | ORAL | 0 refills | Status: AC | PRN

## 2017-05-30 MED ORDER — FUROSEMIDE 10 MG/ML IJ SOLN
40.0000 mg | Freq: Every day | INTRAMUSCULAR | Status: DC
  Administered 2017-05-30: 40 mg via INTRAVENOUS
  Filled 2017-05-30: qty 4

## 2017-05-30 MED ORDER — CEFUROXIME AXETIL 250 MG PO TABS: 250.0000 mg | ORAL_TABLET | Freq: Two times a day (BID) | ORAL | 0 refills | Status: AC

## 2017-05-30 MED ORDER — CARVEDILOL 12.5 MG PO TABS
12.5000 mg | ORAL_TABLET | Freq: Two times a day (BID) | ORAL | 0 refills | Status: AC
Start: 2017-05-30 — End: ?

## 2017-05-30 MED ORDER — FUROSEMIDE 10 MG/ML IJ SOLN: 40.0000 mg | Freq: Every day | INTRAMUSCULAR | 0 refills | Status: AC

## 2017-05-30 MED ORDER — SPIRONOLACTONE 25 MG PO TABS: 25.0000 mg | ORAL_TABLET | Freq: Every day | ORAL | 0 refills | Status: AC

## 2017-05-30 NOTE — Progress Notes (Signed)
Patient is discharge [facility] in a stable condition, report given to floor nurse Tressa BusmanBobby Johnson , awaiting pick by care link

## 2017-05-30 NOTE — Progress Notes (Signed)
Initial Nutrition Assessment  DOCUMENTATION CODES:   Severe malnutrition in context of chronic illness  INTERVENTION:  1. Continue Ensure Enlive po BID, each supplement provides 350 kcal and 20 grams of protein 2. Continue MVI w/ Minerals  NUTRITION DIAGNOSIS:   Malnutrition related to  (COPD, CHF, etoh abuse ) as evidenced by severe depletion of muscle mass, severe depletion of body fat. -ongoing  GOAL:   Patient will meet greater than or equal to 90% of their needs -meeting  MONITOR:   PO intake, Supplement acceptance, Labs, Weight trends, I & O's  REASON FOR ASSESSMENT:   Rounds    ASSESSMENT:   66 year old female with a past medical history of hypertension, chronic systolic CHF, former ETOH abuse, and COPD. She [facility] ER [facility]9 with complaints of shortness of breath onset of symptoms the night of 08/8  Had a rapid response event 8/12 due to SOB and tachycardia, pulm edema. Now on 5L [place]. UTI. Still undergoing cardiology workup, concerning for ischemic disease, outpatient eval pending. Received multiple doses of lasix but her weight is still up 6 pounds from admission. No pulmonary edema per chest x-ray. On comode during time of follow-up. Ate well for breakfast: Scrambled eggs, pork sausage, white toast, grits, orange juice, and apple sauce. Also drank an ensure. No acute complaints from a nutrition perspective.   Intake/Output Summary (Last 24 hours) at 05/30/17 1048 Last data filed at 05/30/17 0957  Gross per 24 hour  Intake              960 ml  Output             1900 ml  Net             -940 ml  4.2L Fluid Negative  Meal Completion: 95-100% Labs reviewed:  Na 134 Medications reviewed and include:  Solumedrol, Senokot  Diet Order:  Diet 2 gram sodium Room service appropriate? Yes; Fluid consistency: Thin  Skin:  Reviewed, no issues  Last BM:  pta  Height:   Ht Readings from Last 1 Encounters:  05/25/17 5\' 7"  (1.702 m)    Weight:   Wt  Readings from Last 1 Encounters:  05/30/17 135 lb 6.4 oz (61.4 kg)    Ideal Body Weight:  61.4 kg  BMI:  Body mass index is 21.21 kg/m.  Estimated Nutritional Needs:   Kcal:  1800-2100kcal/day   Protein:  91-104g/day   Fluid:  >1.8L/day   EDUCATION NEEDS:   Education needs addressed  Dionne AnoWilliam M. Haley Fuerstenberg, MS, RD LDN Inpatient Clinical Dietitian Pager [phone]

## 2017-05-30 NOTE — Discharge Summary (Signed)
[facility]   PATIENT NAME: Paige Kelley    MR#:  [id number]  DATE OF BIRTH:  02-04-1951  DATE OF ADMISSION:  05/25/2017 ADMITTING PHYSICIAN: Enedina Finner, MD  DATE OF DISCHARGE: 05/30/2017  PRIMARY CARE PHYSICIAN: Pricilla Holm, MD    ADMISSION DIAGNOSIS:  Respiratory distress [R06.03] COPD exacerbation (HCC) [J44.1]  DISCHARGE DIAGNOSIS:  Active Problems:   Acute on chronic respiratory failure (HCC)   Protein-calorie malnutrition, severe   Ac on ch systolic CHF    UTI    COPD exacerbation.  SECONDARY DIAGNOSIS:   Past Medical History:  Diagnosis Date  . Alcohol use   . Chronic systolic CHF (congestive heart failure) (HCC)    a. TTE 04/2017: EF 20-25%, unable to exclude RWMA, GR2DD, mild AI, severe MR, mild biatrial enlargement, mod-sev TR, moderately elevated PASP  . Hypertension   . Noncompliance   . Tobacco abuse     HOSPITAL COURSE:   Briggett Tuccillo a 66 y.o. femalewith a known history of Alcohol abuse, chronic systolic congestive heart failure. 25%, COPD with ongoing tobacco abuse ,chronic home oxygen comes to the emergency room with increasing shortness of breath. Patient appears very tight and unable to move much air.   * Acute on chronic hypoxic respiratory failure secondary to COPD/emphysema exacerbation and acute on chronic systolic congestive heart failure. - IV Solu-Medrol, nebulizer, BiPAP, inhalers - IV Lasix 20 mg twice a day-given initially and held later as pt had fatigue, likely over diuresis. - Pulmonary consultation appreciated with Dr. Belia Heman - was on nasal canula. ( Baseline 3 ltr/min)  - now 05/29/17 again require high oxygen than baseline and have pulm edema, Restarted Lasix, added spironolactone and losartan    [facility] cardiology consult.   Suggested to cont IV lasix for 2-3 more days and in future, switch losartan to enteresto.   Also suggested to have cath of left and right heart in future  as out pt/  * Acute on chronic congestive heart failure systolic- EF 25% -Elevated BNP -Chest x-ray does now show any pulmonary edema  - Oral lasix switch to iv lasix now, monitor renal func and potassium. - cardiology consult. - Added spironolactone and low dose losartan.  * UTI   IV rocephin. Follow ur cx   Give cefuroxime oral.  * Tobacco abuse. Patient continues to smoke about 2-3 cigarettes per day. Smoking cessation advised more than 4 minutes spent.  * DVT prophylaxis subcutaneous Lovenox.   DISCHARGE CONDITIONS:   Stable.  CONSULTS OBTAINED:  Treatment Team:  Iran Ouch, MD  DRUG ALLERGIES:  No Known Allergies  DISCHARGE MEDICATIONS:   Current Discharge Medication List    START taking these medications   Details  budesonide (PULMICORT) 0.25 MG/2ML nebulizer solution Take 2 mLs (0.25 mg total) by nebulization 2 (two) times daily. Qty: 60 mL, Refills: 12    cefUROXime (CEFTIN) 250 MG tablet Take 1 tablet (250 mg total) by mouth 2 (two) times daily. For 3 days Qty: 6 tablet, Refills: 0    feeding supplement, ENSURE ENLIVE, (ENSURE ENLIVE) LIQD Take 237 mLs by mouth 2 (two) times daily between meals. Qty: 237 mL, Refills: 12    furosemide (LASIX) 10 MG/ML injection Inject 4 mLs (40 mg total) into the vein daily. Qty: 4 mL, Refills: 0    ipratropium-albuterol (DUONEB) 0.5-2.5 (3) MG/3ML SOLN Take 3 mLs by nebulization every 6 (six) hours as needed. Qty: 360 mL, Refills: 0    losartan (  COZAAR) 25 MG tablet Take 1 tablet (25 mg total) by mouth daily. Qty: 30 tablet, Refills: 0    Multiple Vitamin (MULTIVITAMIN WITH MINERALS) TABS tablet Take 1 tablet by mouth daily. Qty: 30 tablet, Refills: 0    predniSONE (STERAPRED UNI-PAK 21 TAB) 10 MG (21) TBPK tablet Take 6 tabs first day, 5 tab on day 2, then 4 on day 3rd, 3 tabs on day 4th , 2 tab on day 5th, and 1 tab on 6th day. Qty: 21 tablet, Refills: 0    senna (SENOKOT) 8.6 MG TABS tablet Take 1  tablet (8.6 mg total) by mouth 2 (two) times daily. Qty: 120 each, Refills: 0    spironolactone (ALDACTONE) 25 MG tablet Take 1 tablet (25 mg total) by mouth daily. Qty: 30 tablet, Refills: 0    traMADol (ULTRAM) 50 MG tablet Take 1 tablet (50 mg total) by mouth every 6 (six) hours as needed for moderate pain. Qty: 30 tablet, Refills: 0      CONTINUE these medications which have CHANGED   Details  carvedilol (COREG) 12.5 MG tablet Take 1 tablet (12.5 mg total) by mouth 2 (two) times daily with a meal. Qty: 60 tablet, Refills: 0      STOP taking these medications     furosemide (LASIX) 20 MG tablet      albuterol (PROVENTIL) (2.5 MG/3ML) 0.083% nebulizer solution          DISCHARGE INSTRUCTIONS:    Follow renal func and potassium daily until on IV lasix for next 3 days, then may switch to oral lasix. Need follow up in cardio clinic in 1-2 weeks, need to have cardiac cath as out pt.  If you experience worsening of your admission symptoms, develop shortness of breath, life threatening emergency, suicidal or homicidal thoughts you must seek medical attention immediately by calling 911 or calling your MD immediately  if symptoms less severe.  You Must read complete instructions/literature along with all the possible adverse reactions/side effects for all the Medicines you take and that have been prescribed to you. Take any new Medicines after you have completely understood and accept all the possible adverse reactions/side effects.   Please note  You were cared for by a hospitalist during your hospital stay. If you have any questions about your discharge medications or the care you received while you were in the hospital after you are discharged, you can call the unit and asked to speak with the hospitalist on call if the hospitalist that took care of you is not available. Once you are discharged, your primary care physician will handle any further medical issues. Please note that NO  REFILLS for any discharge medications will be authorized once you are discharged, as it is imperative that you return to your primary care physician (or establish a relationship with a primary care physician if you do not have one) for your aftercare needs so that they can reassess your need for medications and monitor your lab values.    Today   CHIEF COMPLAINT:   Chief Complaint  Patient presents with  . Respiratory Distress    HISTORY OF PRESENT ILLNESS:  Kathreen CornfieldWanda Ledvina  is a 66 y.o. female with a known history of Alcohol abuse, chronic systolic congestive heart failure. 25%, COPD with ongoing tobacco abuse , chronic home oxygen comes to the emergency room with increasing shortness of breath. Patient appears very tight and unable to move much air. She was placed on BiPAP. Her sats were down  in the 60s to 46s initially when EMS arrived. During my evaluation patient was 100% on BiPAP. We try to take patient off BiPAP however she remained 92% on 2 L nasal cannula oxygen but she was not feeling well and had increased work of breathing and hence placed back on BiPAP again. Patient is being admitted to stepdown for acute on chronic hypoxic respiratory failure secondary to acute on chronic COPD and congestive heart failure systolic. Patient received IV Solu-Medrol breathing treatment in the ER. I have started patient on some Lasix IV also.   VITAL SIGNS:  Blood pressure 118/73, pulse 95, temperature 98.1 F (36.7 C), resp. rate 19, height 5\' 7"  (1.702 m), weight 61.4 kg (135 lb 6.4 oz), SpO2 99 %.  I/O:   Intake/Output Summary (Last 24 hours) at 05/30/17 1456 Last data filed at 05/30/17 1336  Gross per 24 hour  Intake              960 ml  Output             2851 ml  Net            -1891 ml    PHYSICAL EXAMINATION:  GENERAL:  66 y.o.-year-old patient lying in the bed with no acute distress.  EYES: Pupils equal, round, reactive to light and accommodation. No scleral icterus. Extraocular  muscles intact.  HEENT: Head atraumatic, normocephalic. Oropharynx and nasopharynx clear.  NECK:  Supple, no jugular venous distention. No thyroid enlargement, no tenderness.  LUNGS: Normal breath sounds bilaterally, have no wheezing, bilateral crepitation. No use of accessory muscles of respiration.  CARDIOVASCULAR: S1, S2 normal. No murmurs, rubs, or gallops.  ABDOMEN: Soft, nontender, nondistended. Bowel sounds present. No organomegaly or mass.  EXTREMITIES: No pedal edema, cyanosis, or clubbing.  NEUROLOGIC: Cranial nerves II through XII are intact. Muscle strength 4/5 in all extremities. Sensation intact. Gait not checked.  PSYCHIATRIC: The patient is alert and oriented x 3.  SKIN: No obvious rash, lesion, or ulcer.   DATA REVIEW:   CBC  Recent Labs Lab 05/28/17 0442  WBC 6.9  HGB 11.6*  HCT 34.7*  PLT 302    Chemistries   Recent Labs Lab 05/30/17 0846  NA 134*  K 4.4  CL 90*  CO2 33*  GLUCOSE 163*  BUN 40*  CREATININE 0.85  CALCIUM 9.1    Cardiac Enzymes  Recent Labs Lab 05/25/17 0906  TROPONINI 0.04*    Microbiology Results  Results for orders placed or performed during the hospital encounter of 05/25/17  MRSA PCR Screening     Status: None   Collection Time: 05/25/17 11:50 AM  Result Value Ref Range Status   MRSA by PCR NEGATIVE NEGATIVE Final    Comment:        The GeneXpert MRSA Assay (FDA approved for NASAL specimens only), is one component of a comprehensive MRSA colonization surveillance program. It is not intended to diagnose MRSA infection nor to guide or monitor treatment for MRSA infections.   Urine Culture     Status: Abnormal (Preliminary result)   Collection Time: 05/28/17 10:45 AM  Result Value Ref Range Status   Specimen Description URINE, RANDOM  Final   Special Requests NONE  Final   Culture (A)  Final    >=100,000 COLONIES/mL STAPHYLOCOCCUS SPECIES (COAGULASE NEGATIVE) SUSCEPTIBILITIES TO FOLLOW Performed at [facility], [address] [place], [place] [address]    Report Status PENDING  Incomplete    RADIOLOGY:  Dg  Chest Port 1 View  Result Date: 05/28/2017 CLINICAL DATA:  Dyspnea.  Smoker. EXAM: PORTABLE CHEST 1 VIEW COMPARISON:  05/25/2017. FINDINGS: Stable enlarged cardiac silhouette. Mild increase in prominence of the pulmonary vasculature and interstitial markings with Kerley lines. The lungs remain hyperexpanded. Old, healed bilateral rib fractures. Diffuse osteopenia. IMPRESSION: Interval mild changes of congestive heart failure superimposed on COPD with stable cardiomegaly. Electronically Signed   By: Beckie Salts M.D.   On: 05/28/2017 18:05    EKG:   Orders placed or performed during the hospital encounter of 05/25/17  . EKG 12-Lead  . EKG 12-Lead      Management plans discussed with the patient, family and they are in agreement.  CODE STATUS:     Code Status Orders        Start     Ordered   05/25/17 1149  Do not attempt resuscitation (DNR)  Continuous    Question Answer Comment  In the event of cardiac or respiratory ARREST Do not call a "code blue"   In the event of cardiac or respiratory ARREST Do not perform Intubation, CPR, defibrillation or ACLS   In the event of cardiac or respiratory ARREST Use medication by any route, position, wound care, and other measures to relive pain and suffering. May use oxygen, suction and manual treatment of airway obstruction as needed for comfort.      05/25/17 1148    Code Status History    Date Active Date Inactive Code Status Order ID Comments User Context   05/04/2017  9:51 AM 05/04/2017  7:02 PM DNR [id number]  Enid Baas, MD Inpatient   04/30/2017 11:38 PM 05/04/2017  9:51 AM Full Code [id number]  Enedina Finner, MD Inpatient   03/10/2017 11:02 PM 03/12/2017  3:02 PM Full Code [id number]  Oralia Manis, MD Inpatient      TOTAL TIME TAKING CARE OF THIS PATIENT: 35 minutes.    Altamese Dilling M.D on 05/30/2017 at 2:56  PM  Between 7am to 6pm - Pager - [phone]  After 6pm go to [email or web address] - Social research officer, government  [facility]  Office  [phone]  CC: Primary care physician; Pricilla Holm, MD   Note: This dictation was prepared with Dragon dictation along with smaller phrase technology. Any transcriptional errors that result from this process are unintentional.

## 2017-05-30 NOTE — Progress Notes (Signed)
Progress Note  Patient Name: Paige Kelley Date of Encounter: 05/30/2017  Primary Cardiologist: Concha Se. Gollan, MD   Subjective   Breathing about the same - not at baseline.  No chest pain.  Inpatient Medications    Scheduled Meds: . budesonide (PULMICORT) nebulizer solution  0.25 mg Nebulization BID  . carvedilol  12.5 mg Oral BID WC  . chlorhexidine  15 mL Mouth Rinse BID  . enoxaparin (LOVENOX) injection  40 mg Subcutaneous Q24H  . feeding supplement (ENSURE ENLIVE)  237 mL Oral BID BM  . furosemide  40 mg Oral Daily  . guaiFENesin  600 mg Oral BID  . ipratropium-albuterol  3 mL Nebulization TID  . losartan  25 mg Oral Daily  . mouth rinse  15 mL Mouth Rinse q12n4p  . methylPREDNISolone (SOLU-MEDROL) injection  40 mg Intravenous Q12H  . multivitamin with minerals  1 tablet Oral Daily  . senna  1 tablet Oral BID  . spironolactone  25 mg Oral Daily   Continuous Infusions: . cefTRIAXone (ROCEPHIN)  IV Stopped (05/29/17 0958)   PRN Meds: acetaminophen **OR** acetaminophen, ipratropium-albuterol, ondansetron **OR** ondansetron (ZOFRAN) IV, polyethylene glycol, traMADol   Vital Signs    Vitals:   05/29/17 1730 05/29/17 1950 05/29/17 1956 05/30/17 0413  BP: 140/87 118/77  124/81  Pulse: (!) 107 87  94  Resp:  17  17  Temp:  98.6 F (37 C)  98.2 F (36.8 C)  TempSrc:  Oral  Oral  SpO2: 98% 97% 97% 94%  Weight:   135 lb 1.6 oz (61.3 kg) 135 lb 6.4 oz (61.4 kg)  Height:        Intake/Output Summary (Last 24 hours) at 05/30/17 0926 Last data filed at 05/30/17 0057  Gross per 24 hour  Intake              960 ml  Output             1900 ml  Net             -940 ml   Filed Weights   05/25/17 1149 05/29/17 1956 05/30/17 0413  Weight: 134 lb 0.6 oz (60.8 kg) 135 lb 1.6 oz (61.3 kg) 135 lb 6.4 oz (61.4 kg)    Physical Exam   GEN: Well nourished, well developed, in no acute distress.  HEENT: Grossly normal.  Neck: Supple, JVD to jaw, no carotid bruits, or  masses. Cardiac: RRR,distant, 2/6 syst murmur @ LLSB  apex, no rubs, or gallops. No clubbing, cyanosis, edema.  Radials/DP/PT 2+ and equal bilaterally.  Respiratory:  Respirations regular and unlabored, markedly diminished breath sounds bilat. GI: Soft, nontender, nondistended, BS + x 4. MS: no deformity or atrophy. Skin: warm and dry, no rash. Neuro:  Strength and sensation are intact. Psych: AAOx3.  Normal affect.  Labs    Chemistry Recent Labs Lab 05/27/17 0422 05/28/17 0442 05/30/17 0846  NA 137 136 134*  K 3.9 4.5 4.4  CL 94* 96* 90*  CO2 32 33* 33*  GLUCOSE 116* 120* 163*  BUN 44* 37* 40*  CREATININE 0.88 0.74 0.85  CALCIUM 8.9 8.8* 9.1  GFRNONAA >60 >60 >60  GFRAA >60 >60 >60  ANIONGAP 11 7 11      Hematology Recent Labs Lab 05/25/17 0906 05/28/17 0442  WBC 10.3 6.9  RBC 4.04 3.69*  HGB 12.7 11.6*  HCT 38.0 34.7*  MCV 94.0 93.9  MCH 31.5 31.6  MCHC 33.5 33.6  RDW 13.6 13.5  PLT  369 302    Cardiac Enzymes Recent Labs Lab 05/25/17 0906  TROPONINI 0.04*      BNP Recent Labs Lab 05/25/17 0906  BNP 1,903.0*     Radiology    Dg Chest Port 1 View  Result Date: 05/28/2017 CLINICAL DATA:  Dyspnea.  Smoker. EXAM: PORTABLE CHEST 1 VIEW COMPARISON:  05/25/2017. FINDINGS: Stable enlarged cardiac silhouette. Mild increase in prominence of the pulmonary vasculature and interstitial markings with Kerley lines. The lungs remain hyperexpanded. Old, healed bilateral rib fractures. Diffuse osteopenia. IMPRESSION: Interval mild changes of congestive heart failure superimposed on COPD with stable cardiomegaly. Electronically Signed   By: Beckie Salts M.D.   On: 05/28/2017 18:05    Telemetry    rsr - Personally Reviewed  Cardiac Studies   2D Echocardiogram 7.17.2018  Study Conclusions  - Left ventricle: The cavity size was mildly dilated. Wall   thickness was increased in a pattern of mild LVH. Systolic   function was severely reduced. The estimated  ejection fraction   was in the range of 20% to 25%. Regional wall motion   abnormalities cannot be excluded. Features are consistent with a   pseudonormal left ventricular filling pattern, with concomitant   abnormal relaxation and increased filling pressure (grade 2   diastolic dysfunction). Doppler parameters are consistent with   high ventricular filling pressure. - Aortic valve: There was mild regurgitation. - Mitral valve: There was severe regurgitation. - Left atrium: The atrium was mildly dilated. - Right ventricle: The cavity size was mildly dilated. Systolic   function was mildly to moderately reduced. - Tricuspid valve: There was moderate-severe regurgitation. - Pulmonary arteries: Systolic pressure was moderately increased.  Patient Profile     66 y.o. female with a history of ETOH abuse, Tob Abuse, cardiomyopathy, HFrEF, COPD, and noncompliance, readmitted 8/9 with resp failure and CHF (EF 20-25%).  Assessment & Plan    1.  Acute on chronic multifactorial respiratory failure:  In setting of AECOPD and HFrEF.  Inhalers/nebs/steroids per IM.  Cont cardiac meds as outlined below.  2.  Acute on chronic systolic CHF/cardiomoyopathy (?ICM vs NICM);  EF 20-25% by echo in July w/ mod TR/MR. Did not show up for f/u CHF clinic appt.  Readmitted with resp failure/AECOPD.  Developed dyspnea in setting of elevated BPs on evening of 8/12 and required IV lasix 8/12 and 13.  Wt is still above wt in July.  Renal fxn stable. Cont IV lasix today.  Cont  blocker, ARB, spiro.  Received acei on 8/13, so will have to wait another two days prior to considering switch to entresto.  Plan outpt ischemic eval pending f/u.  3.  UTI:  abx per IM.  4.  Tob /ETOH abuse:  She says that she is committed to quitting smoking.  She hasn't had a drink since her July admission.  5.  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  Signed, Nicolasa Ducking, NP  05/30/2017, 9:26 AM

## 2017-05-30 NOTE — Care Management (Addendum)
Patient has been accepted by [facility].  Patient and her brother in  agreement.  Attending in agreement if cardiac issues can be met. Spoke with [facility] representative.  Patient is requiring IV lasix.  Attending will complete DC summary and Emtala.  Updated primary nurse that room number and accepting physician will be called to her and will be given phone number to call report.  Informed that would need to call [facility] for transport. Advised to make every effort not prevent delays in process. Notified [facility] of transfer to [facility].

## 2017-05-30 NOTE — Discharge Instructions (Signed)
Need to check BMP daily for renal func and Potassium as on IV lasix. After 3 days, check need for IV lasix further, and may be able to change to oral lasix. Once stable, may stop losartan and start enteresto or this can be done after cardiology clinic visit in 1 week.

## 2017-05-30 NOTE — Progress Notes (Signed)
Physical Therapy Treatment Patient Details Name: Paige Kelley MRN: [id number] DOB: 1951-09-15 Today's Date: 05/30/2017    History of Present Illness 66 y.o.femalewith history of COPD, Alcohol & tobacco abuse, CHF who was admitted 3 weeks ago with SOB.  Now admitted again with acute on chronic respiratory failure.     PT Comments    Pt continues to need 4 liters O2 and has some fatigue with activity.  We did multiple bouts of sit to stand and though pt was able to achieve standing w/o heavy assist she did need UEs to maintain balance and generally felt very weak and unsteady.  Pt's O2 stayed in the 90s with ambulation, but dropped from the high 90s initially to 91%.  Pt not at all close to her baseline and will need rehab before she is safe to return home.    Follow Up Recommendations  SNF     Equipment Recommendations  Rolling walker with 5" wheels;3in1 (PT);Wheelchair (measurements PT)    Recommendations for Other Services       Precautions / Restrictions Precautions Precautions: Fall Restrictions Weight Bearing Restrictions: No    Mobility  Bed Mobility Overal bed mobility: Modified Independent Bed Mobility: Supine to Sit     Supine to sit: Min guard     General bed mobility comments: Pt able to rise to sitting EOB w/o assist, she had some fatigue but did well  Transfers Overall transfer level: Needs assistance Equipment used: Rolling walker (2 wheeled) Transfers: Sit to/from Stand Sit to Stand: Min guard         General transfer comment: Pt attempted to standing w/o AD, had some unsteadiness, needed UEs to maintain balance  Ambulation/Gait Ambulation/Gait assistance: Min guard Ambulation Distance (Feet): 55 Feet Assistive device: Rolling walker (2 wheeled)       General Gait Details: Pt on 4 liters t/o the effort, she had some fatigue, but ultimately did well. She did not have any unsteadiness but was very tired with the efforrt.    Stairs            Wheelchair Mobility    Modified Rankin (Stroke Patients Only)       Balance Overall balance assessment: Needs assistance Sitting-balance support: No upper extremity supported;Feet supported Sitting balance-Leahy Scale: Good     Standing balance support: Bilateral upper extremity supported Standing balance-Leahy Scale: Fair Standing balance comment: Pt reliant on the walker/PT's hands to maintain standing balance                            Cognition Arousal/Alertness: Awake/alert Behavior During Therapy: WFL for tasks assessed/performed Overall Cognitive Status: Within Functional Limits for tasks assessed                                        Exercises      General Comments        Pertinent Vitals/Pain Pain Assessment:  (general soreness from being in bed, no overt pain)    Home Living                      Prior Function            PT Goals (current goals can now be found in the care plan section) Progress towards PT goals: Progressing toward goals    Frequency    Min  2X/week      PT Plan Current plan remains appropriate    Co-evaluation              AM-PAC PT "6 Clicks" Daily Activity  Outcome Measure  Difficulty turning over in bed (including adjusting bedclothes, sheets and blankets)?: A Little Difficulty moving from lying on back to sitting on the side of the bed? : A Little Difficulty sitting down on and standing up from a chair with arms (e.g., wheelchair, bedside commode, etc,.)?: A Little Help needed moving to and from a bed to chair (including a wheelchair)?: A Little Help needed walking in hospital room?: A Little Help needed climbing 3-5 steps with a railing? : A Lot 6 Click Score: 17    End of Session Equipment Utilized During Treatment: Gait belt;Oxygen Activity Tolerance: Patient limited by fatigue Patient left: with chair alarm set;with call bell/phone within reach;with  nursing/sitter in room Nurse Communication: Mobility status PT Visit Diagnosis: Difficulty in walking, not elsewhere classified (R26.2);Muscle weakness (generalized) (M62.81)     Time: [id number] PT Time Calculation (min) (ACUTE ONLY): 35 min  Charges:  $Gait Training: 8-22 mins $Therapeutic Exercise: 8-22 mins                    G Codes:       Malachi ProGalen R Carle Fenech, DPT 05/30/2017, 3:15 PM

## 2017-05-30 NOTE — Clinical Social Work Note (Signed)
CSW received referral for patient needing to go to SNF for short term rehab.  [facility]  CSW to sign off please reconsult if other social work needs arise.  Ervin KnackEric R. Jazalynn Mireles, MSW, Theresia MajorsLCSWA [phone]  05/30/2017 2:45 PM

## 2017-05-31 LAB — URINE CULTURE: Culture: >=100000 — AB

## 2017-06-20 ENCOUNTER — Other Ambulatory Visit
Admission: RE | Admit: 2017-06-20 | Discharge: 2017-06-20 | Disposition: A | Discharge status: Home / Self Care | Payer: Medicare (Managed Care) | Source: Ambulatory Visit | Attending: Family Medicine | Admitting: Family Medicine

## 2017-06-20 DIAGNOSIS — I509 Heart failure, unspecified: Secondary | ICD-10-CM | POA: Diagnosis present

## 2017-06-20 LAB — BRAIN NATRIURETIC PEPTIDE: B NATRIURETIC PEPTIDE 5: 78 pg/mL (ref 0.0–100.0)

## 2017-12-11 IMAGING — CT CT ANGIO CHEST
1 of 6 series · 19 of 36 positions shown · IV contrast (APPLIED)
Comparison: Chest radiography 04/30/2017.  Chest CT 03/10/2017.

CLINICAL DATA: History of COPD with tobacco abuse. History of
congestive heart failure. Worsening shortness of breath and hypoxia.

EXAM:
CT ANGIOGRAPHY CHEST WITH CONTRAST
TECHNIQUE: Multidetector CT imaging of the chest was performed using the
standard protocol during bolus administration of intravenous
contrast. Multiplanar CT image reconstructions and MIPs were
obtained to evaluate the vascular anatomy.
CONTRAST:  75 cc Isovue 370

[Series 5: thins · axial · 0.66mm/px · z∈[-202,+84]mm · 19 of 318 slices shown]
[im 16/318  lung]
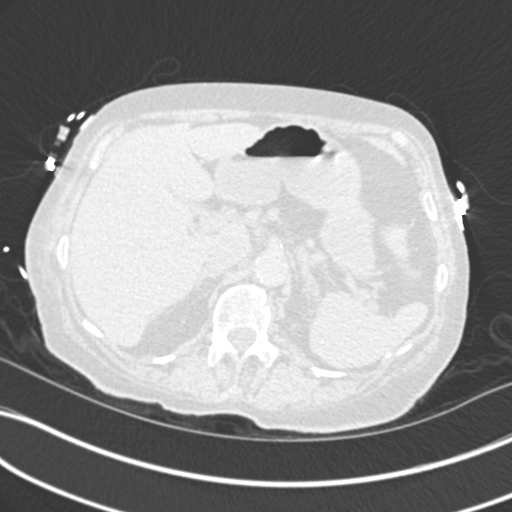
[im 32/318  mediastinal]
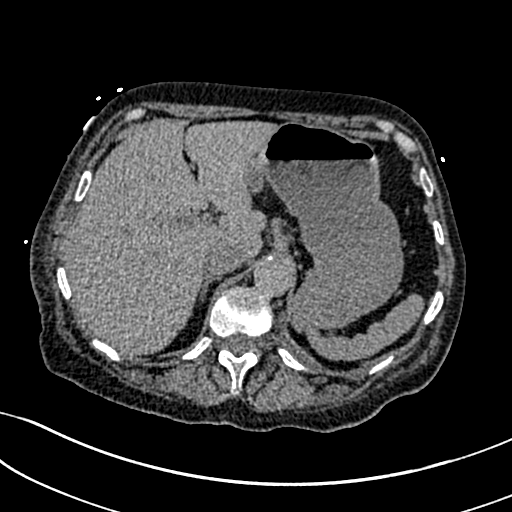
[im 48/318  lung]
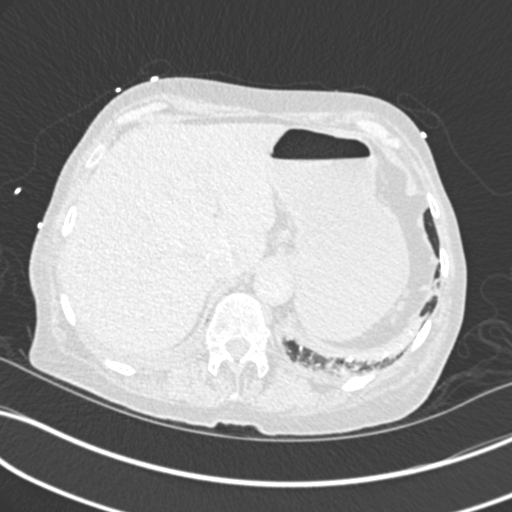
[im 64/318  mediastinal]
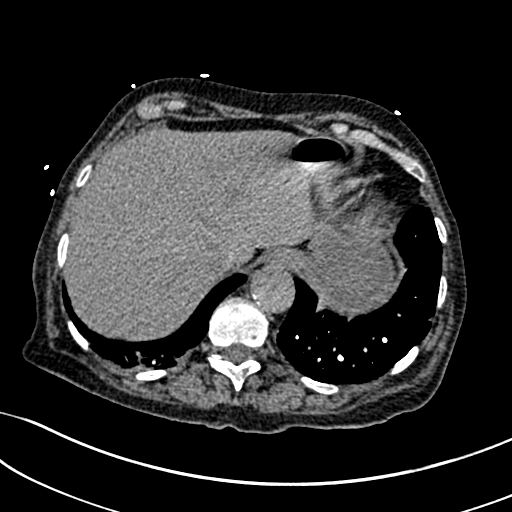
[im 80/318  lung]
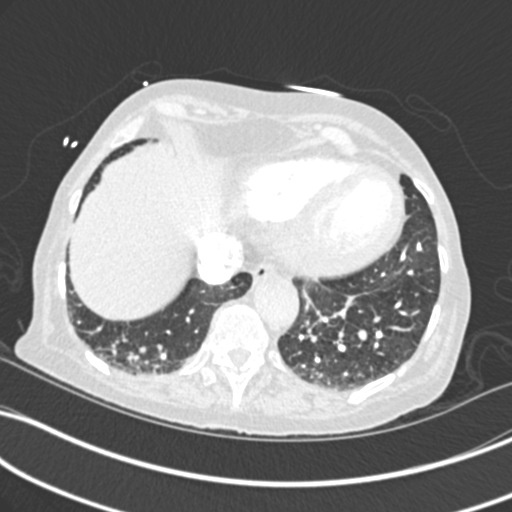
[im 96/318  mediastinal]
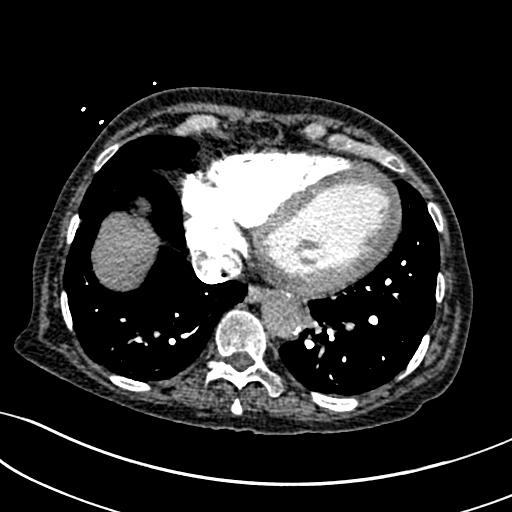
[im 111/318  lung]
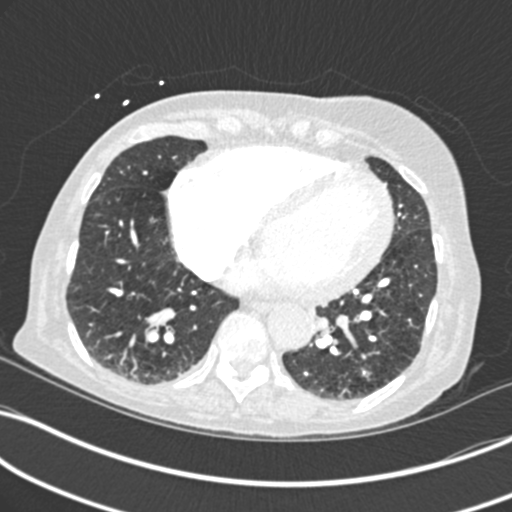
[im 127/318  mediastinal]
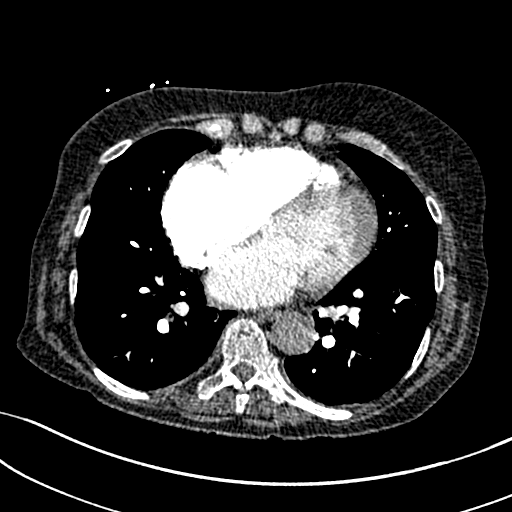
[im 143/318  lung]
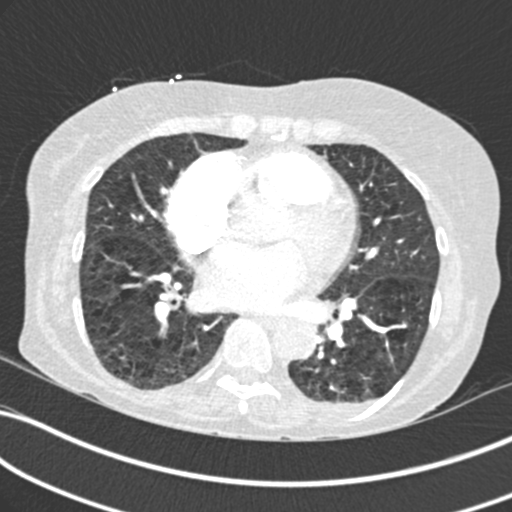
[im 159/318  mediastinal]
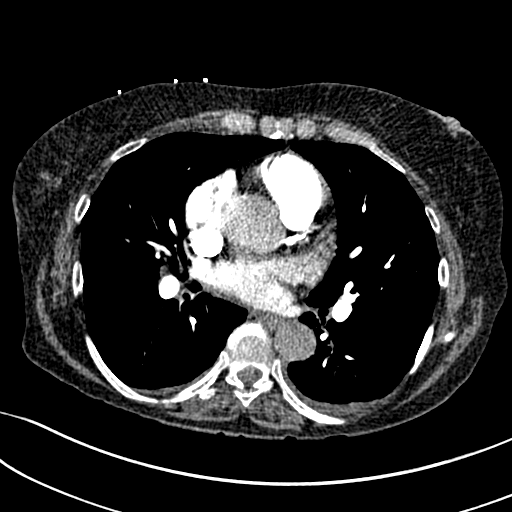
[im 175/318  lung]
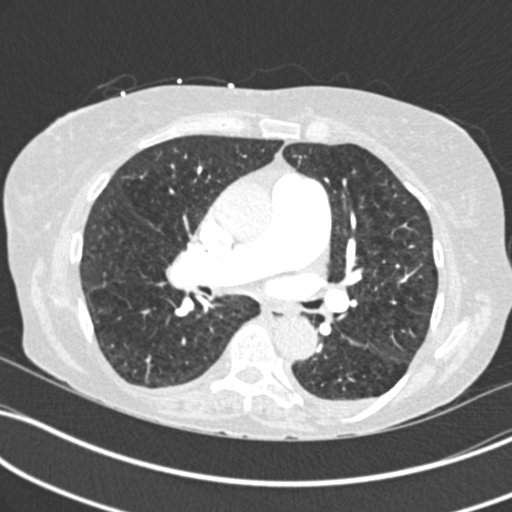
[im 191/318  mediastinal]
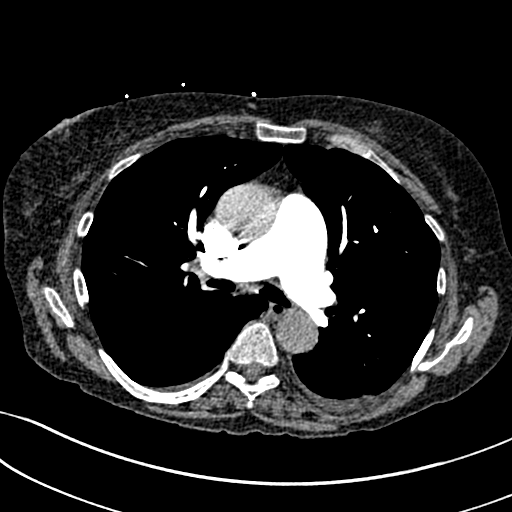
[im 207/318  lung]
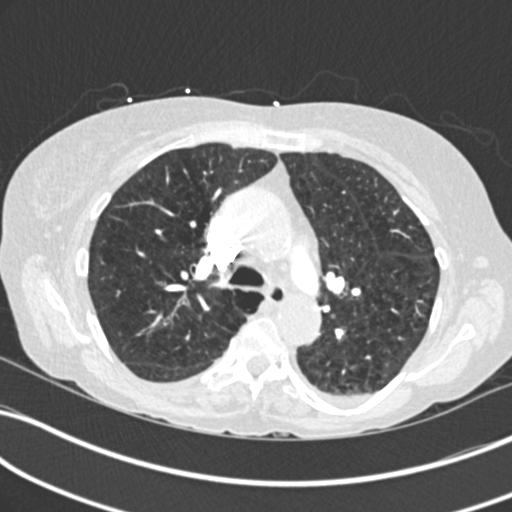
[im 222/318  mediastinal]
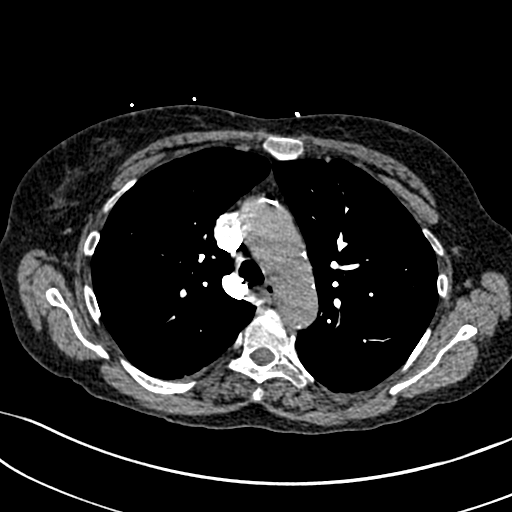
[im 238/318  lung]
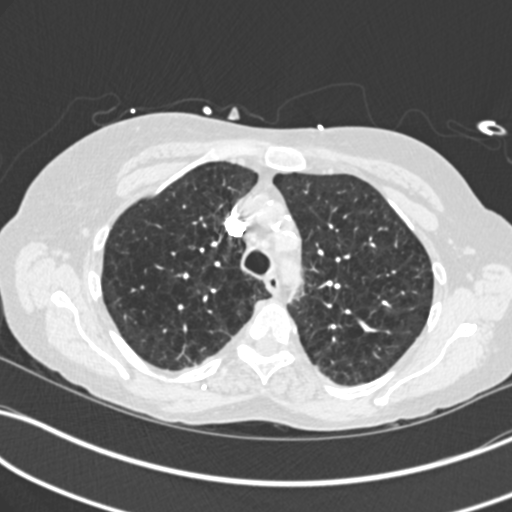
[im 254/318  mediastinal]
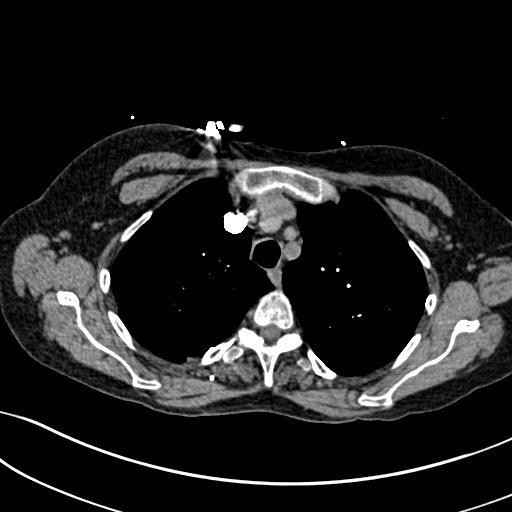
[im 270/318  lung]
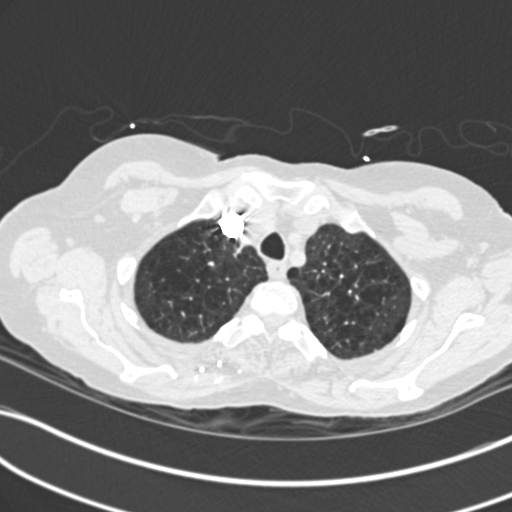
[im 286/318  mediastinal]
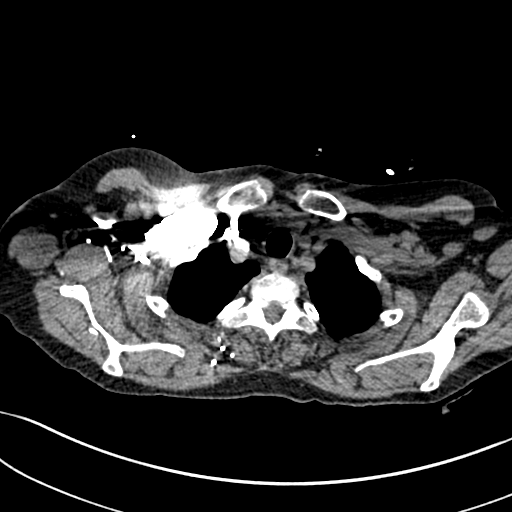
[im 302/318  lung]
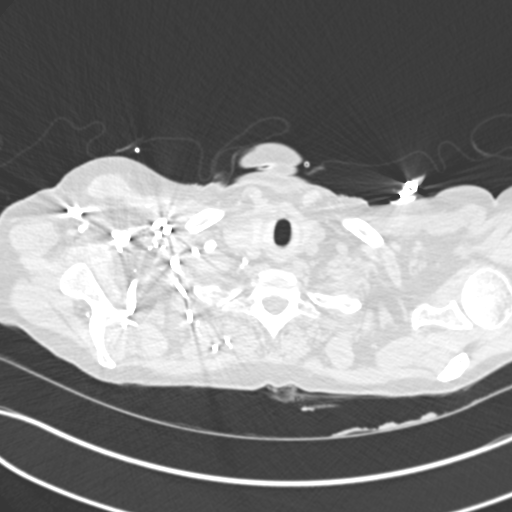

[19 of 36 positions shown; findings below may reference images not displayed]

FINDINGS: Cardiovascular: Mild cardiomegaly. No pericardial fluid. Pulmonary
arterial opacification is good. No pulmonary emboli. Aortic
atherosclerosis. Coronary artery calcification.

Mediastinum/Nodes: No mass or lymphadenopathy.

Lungs/Pleura: Background pattern of advanced emphysema without
dominant bullous disease. No sign of infiltrate. Mild basilar
atelectasis and small bilateral effusions.

Upper Abdomen: Negative

Musculoskeletal: Negative appearance of the spine. Newly seen
healing rib fracture of the left eleventh rib posteriorly, not
present on the previous exam.

Review of the MIP images confirms the above findings.
IMPRESSION: No pulmonary emboli.

Mild basilar atelectasis and tiny pleural effusions.

Healing left eleventh rib fracture, not present on 03/10/2017

Aortic Atherosclerosis (E5GBI-TTH.H) and Emphysema (E5GBI-RD5.U).

## 2017-12-15 DEATH — deceased

## 2018-01-02 IMAGING — DX DG CHEST 1V
1 series · 1 of 1 positions shown · non-contrast
Comparison: 04/30/2017 chest radiograph.

CLINICAL DATA: Dyspnea

EXAM:
CHEST 1 VIEW

[chest ap]
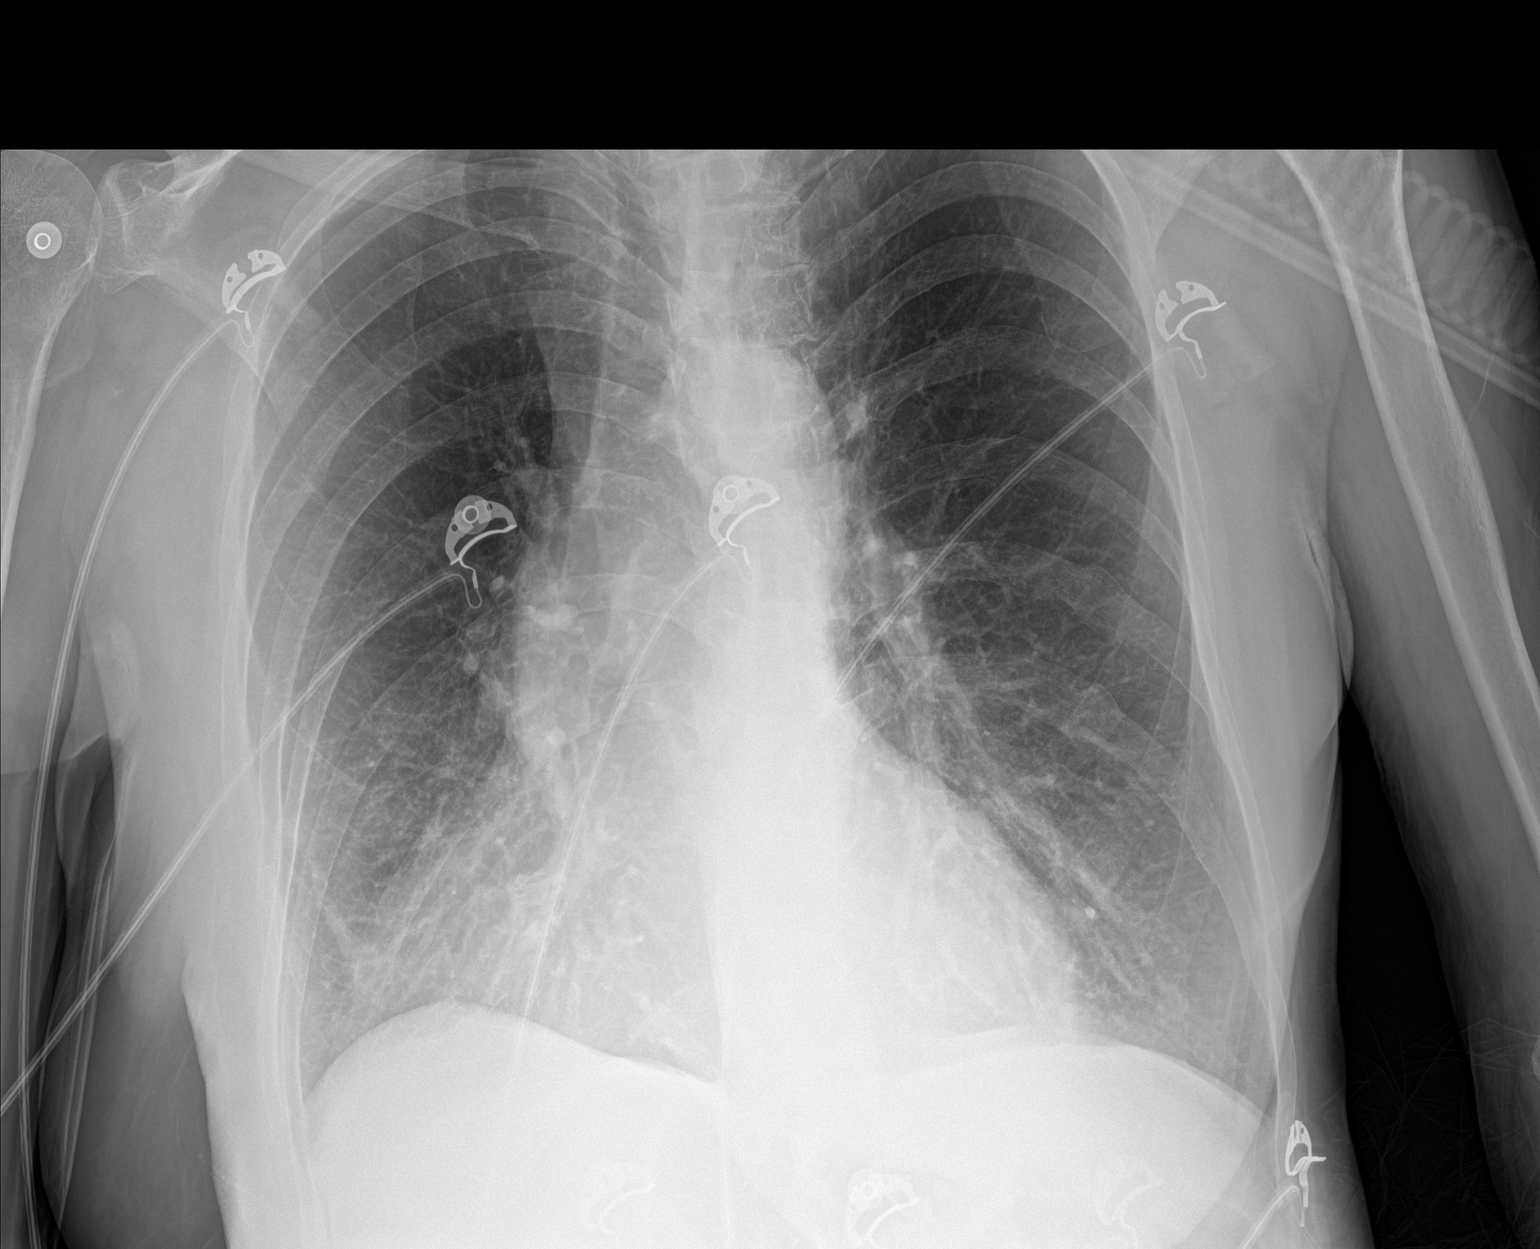

[1 of 1 positions shown; findings below may reference images not displayed]

FINDINGS: Right rotated chest radiograph. Stable cardiomediastinal silhouette
with top-normal heart size. No pneumothorax. No pleural effusion.
Lungs appear clear, with no acute consolidative airspace disease and
no pulmonary edema.
IMPRESSION: No active disease.

## 2018-01-05 IMAGING — DX DG CHEST 1V PORT
1 series · 2 of 2 positions shown · non-contrast
Comparison: 05/25/2017.

CLINICAL DATA: Dyspnea.  Smoker.

EXAM:
PORTABLE CHEST 1 VIEW

[Series 1: chest ap · 0.14mm/px · 2 of 2 slices shown]
[im 1/2]
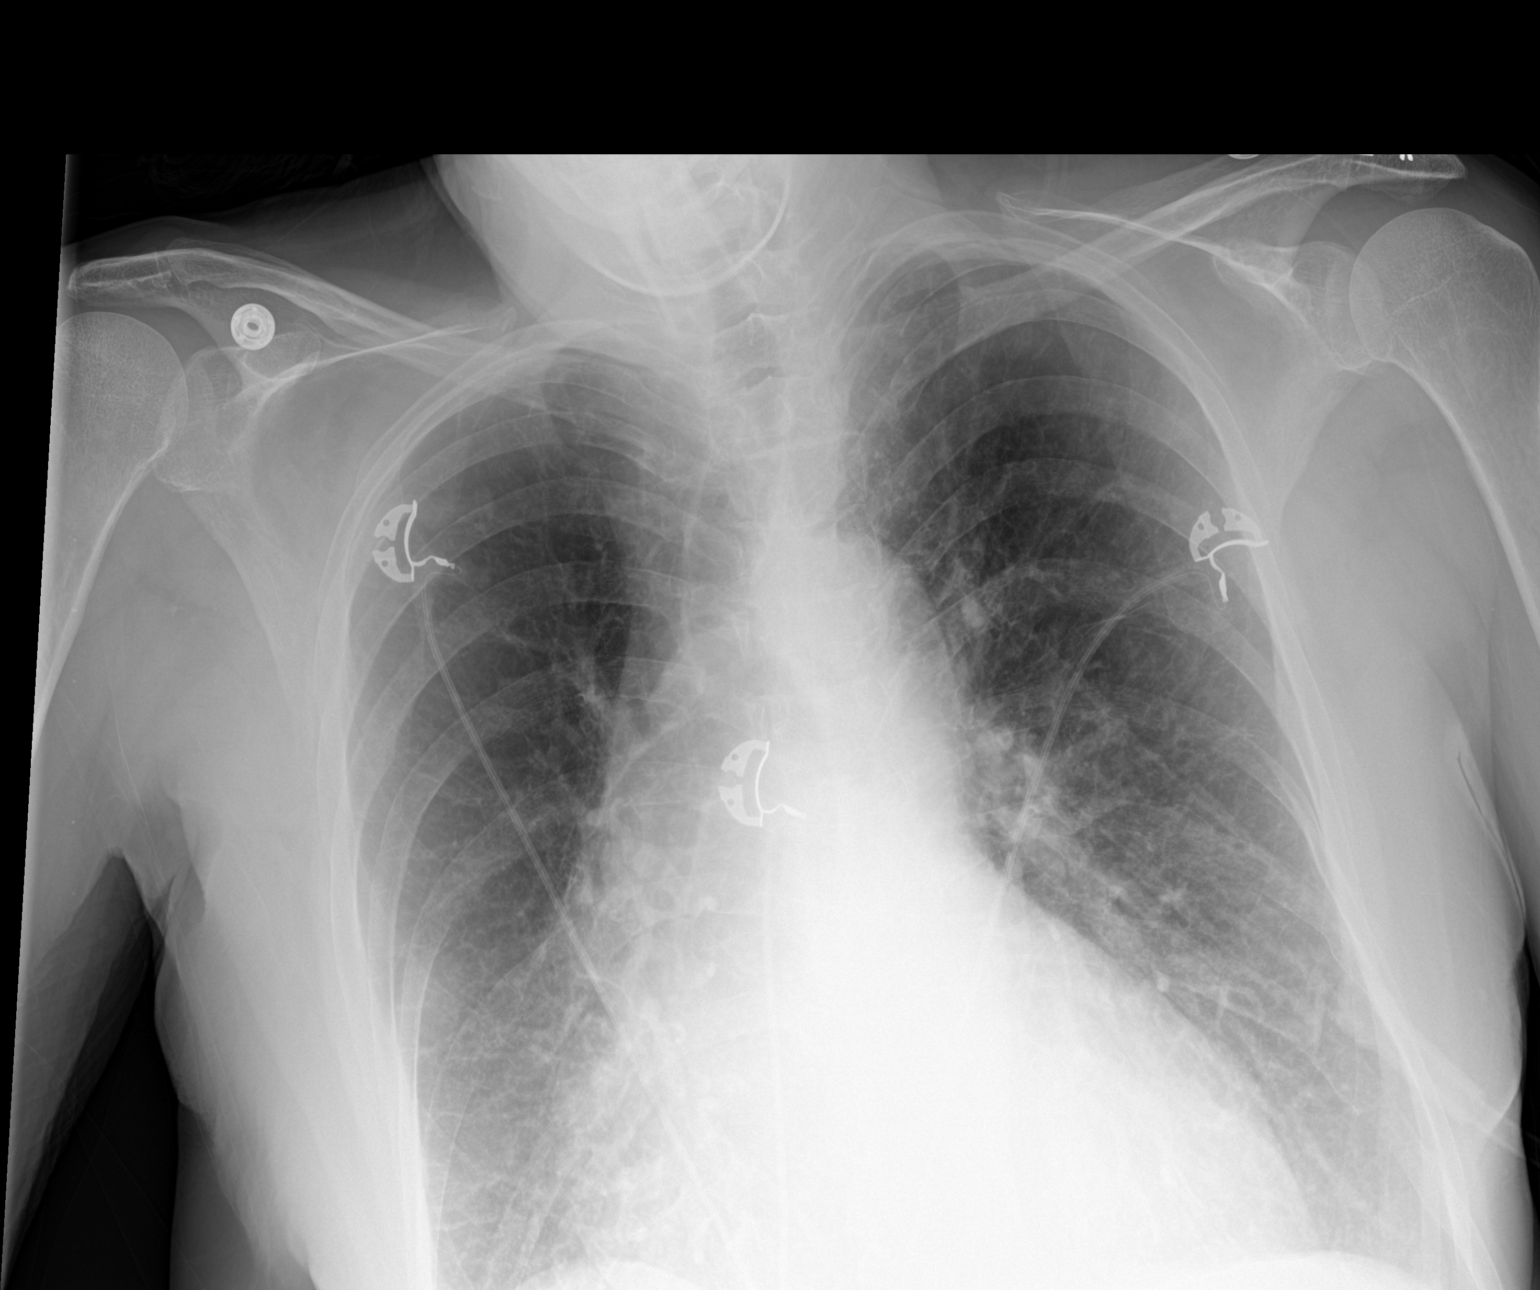
[im 2/2]
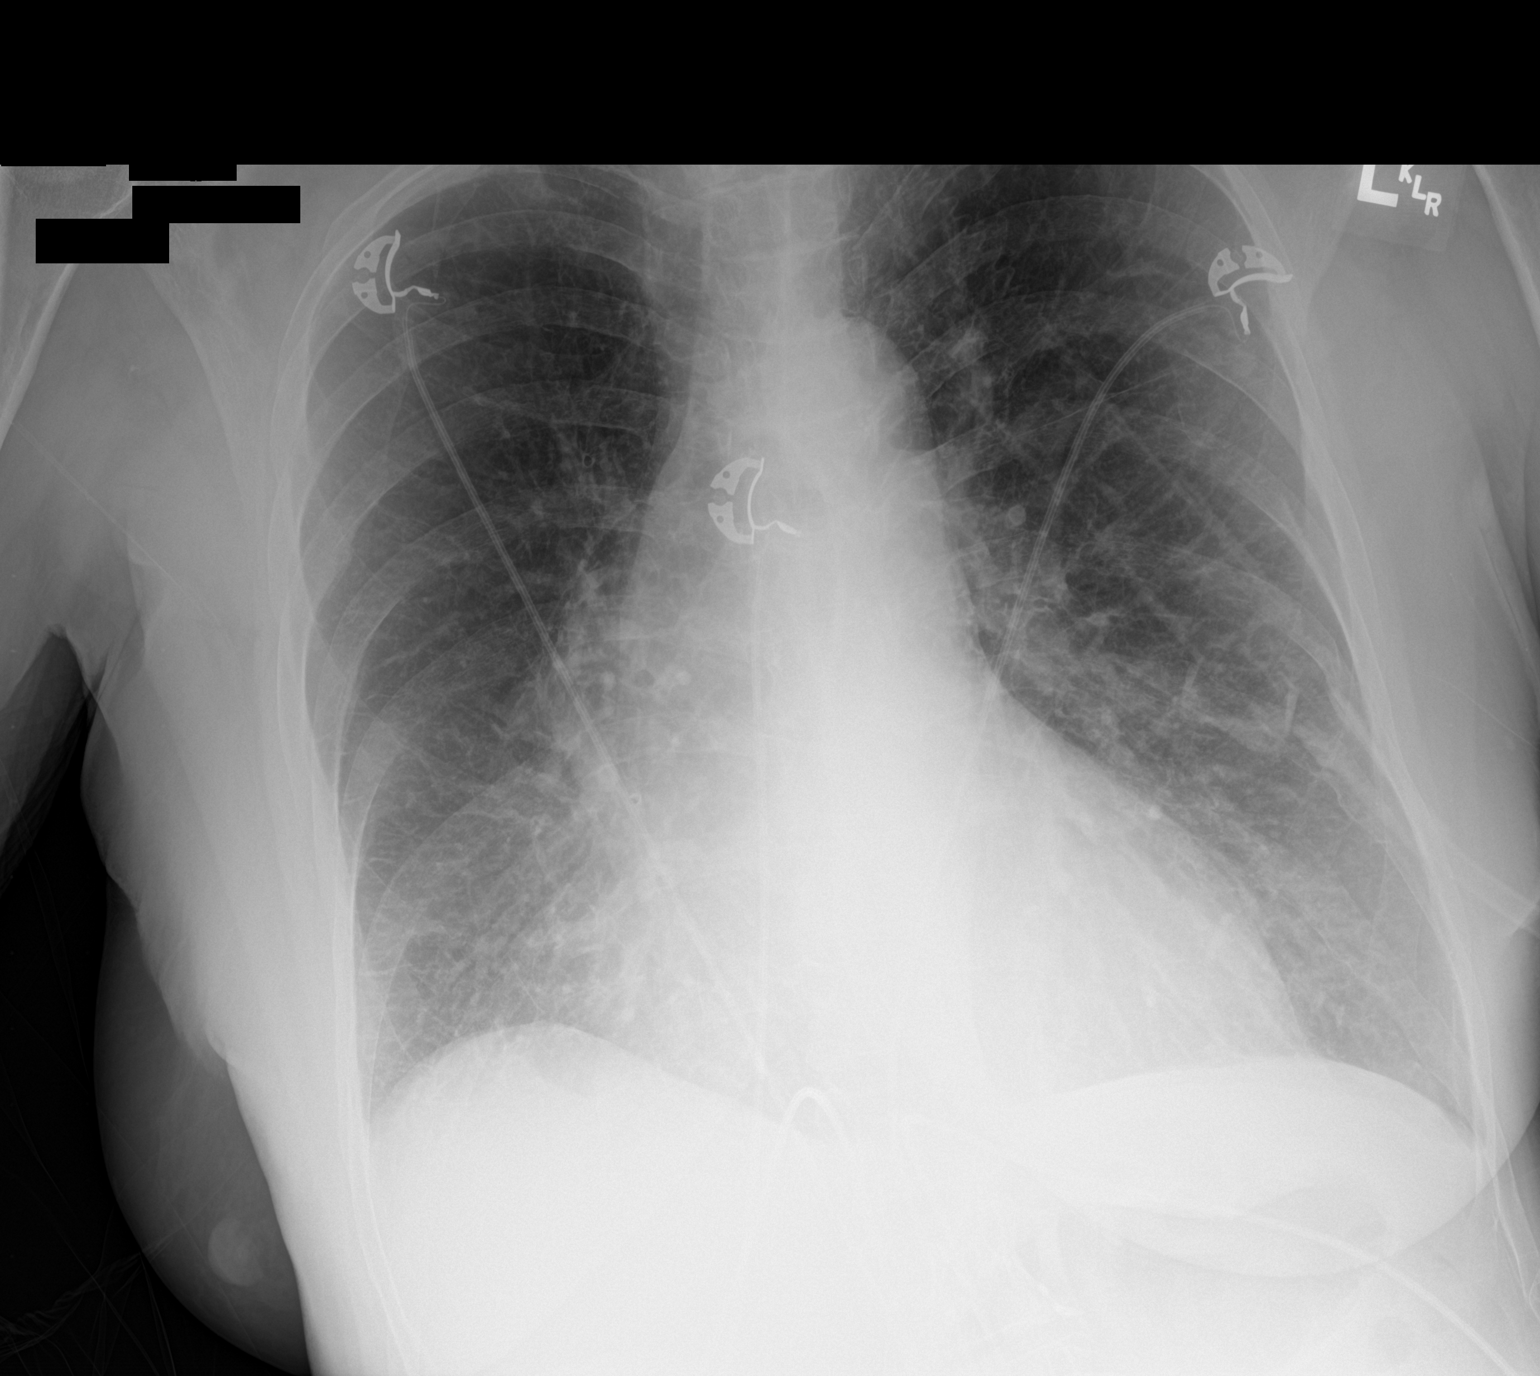

[2 of 2 positions shown; findings below may reference images not displayed]

FINDINGS: Stable enlarged cardiac silhouette. Mild increase in prominence of
the pulmonary vasculature and interstitial markings with Kerley
lines. The lungs remain hyperexpanded. Old, healed bilateral rib
fractures. Diffuse osteopenia.
IMPRESSION: Interval mild changes of congestive heart failure superimposed on
COPD with stable cardiomegaly.
